# Patient Record
Sex: Female | Born: 1990 | Race: Black or African American | Hispanic: No | Marital: Single | Smoking: Never smoker
Health system: Southern US, Community
[De-identification: ages and names within clinical notes are randomized; demographics above are authoritative.]

## PROBLEM LIST (undated history)

## (undated) ENCOUNTER — Inpatient Hospital Stay (HOSPITAL_COMMUNITY): Payer: Self-pay

## (undated) DIAGNOSIS — D649 Anemia, unspecified: Secondary | ICD-10-CM

## (undated) HISTORY — PX: APPENDECTOMY: SHX54

## (undated) HISTORY — DX: Anemia, unspecified: D64.9

---

## 2010-01-20 ENCOUNTER — Inpatient Hospital Stay (HOSPITAL_COMMUNITY): Admission: AD | Admit: 2010-01-20 | Discharge: 2010-01-20 | Payer: Self-pay | Admitting: Obstetrics & Gynecology

## 2011-01-04 LAB — CBC
HCT: 31.4 % — ABNORMAL LOW (ref 36.0–46.0)
Hemoglobin: 10.5 g/dL — ABNORMAL LOW (ref 12.0–15.0)
MCHC: 33.3 g/dL (ref 30.0–36.0)
MCV: 98.6 fL (ref 78.0–100.0)
RBC: 3.18 MIL/uL — ABNORMAL LOW (ref 3.87–5.11)

## 2011-01-04 LAB — GC/CHLAMYDIA PROBE AMP, GENITAL: GC Probe Amp, Genital: NEGATIVE

## 2011-01-04 LAB — WET PREP, GENITAL
Trich, Wet Prep: NONE SEEN
Yeast Wet Prep HPF POC: NONE SEEN

## 2013-04-30 LAB — OB RESULTS CONSOLE ANTIBODY SCREEN: Antibody Screen: NEGATIVE

## 2013-04-30 LAB — OB RESULTS CONSOLE RUBELLA ANTIBODY, IGM: Rubella: IMMUNE

## 2013-08-01 ENCOUNTER — Encounter: Payer: Self-pay | Admitting: *Deleted

## 2013-08-06 ENCOUNTER — Encounter: Payer: Self-pay | Admitting: *Deleted

## 2013-08-06 DIAGNOSIS — O09293 Supervision of pregnancy with other poor reproductive or obstetric history, third trimester: Secondary | ICD-10-CM

## 2013-08-06 DIAGNOSIS — O34219 Maternal care for unspecified type scar from previous cesarean delivery: Secondary | ICD-10-CM | POA: Insufficient documentation

## 2013-08-13 ENCOUNTER — Ambulatory Visit (INDEPENDENT_AMBULATORY_CARE_PROVIDER_SITE_OTHER): Payer: Self-pay | Admitting: Obstetrics and Gynecology

## 2013-08-13 ENCOUNTER — Encounter: Payer: Self-pay | Admitting: Obstetrics and Gynecology

## 2013-08-13 VITALS — BP 111/59 | Temp 97.6°F | Ht 64.0 in | Wt 165.5 lb

## 2013-08-13 DIAGNOSIS — O09293 Supervision of pregnancy with other poor reproductive or obstetric history, third trimester: Secondary | ICD-10-CM

## 2013-08-13 DIAGNOSIS — O09299 Supervision of pregnancy with other poor reproductive or obstetric history, unspecified trimester: Secondary | ICD-10-CM

## 2013-08-13 DIAGNOSIS — O34219 Maternal care for unspecified type scar from previous cesarean delivery: Secondary | ICD-10-CM

## 2013-08-13 LAB — CBC
HCT: 29.2 % — ABNORMAL LOW (ref 36.0–46.0)
Hemoglobin: 10.3 g/dL — ABNORMAL LOW (ref 12.0–15.0)
MCHC: 35.3 g/dL (ref 30.0–36.0)
Platelets: 267 10*3/uL (ref 150–400)
RDW: 13 % (ref 11.5–15.5)
WBC: 7.2 10*3/uL (ref 4.0–10.5)

## 2013-08-13 LAB — POCT URINALYSIS DIP (DEVICE)
Bilirubin Urine: NEGATIVE
Glucose, UA: NEGATIVE mg/dL
Leukocytes, UA: NEGATIVE
Specific Gravity, Urine: 1.015 (ref 1.005–1.030)
Urobilinogen, UA: 0.2 mg/dL (ref 0.0–1.0)
pH: 7 (ref 5.0–8.0)

## 2013-08-13 NOTE — Patient Instructions (Signed)
Etonogestrel implant What is this medicine? ETONOGESTREL is a contraceptive (birth control) device. It is used to prevent pregnancy. It can be used for up to 3 years. This medicine may be used for other purposes; ask your health care provider or pharmacist if you have questions. What should I tell my health care provider before I take this medicine? They need to know if you have any of these conditions: -abnormal vaginal bleeding -blood vessel disease or blood clots -cancer of the breast, cervix, or liver -depression -diabetes -gallbladder disease -headaches -heart disease or recent heart attack -high blood pressure -high cholesterol -kidney disease -liver disease -renal disease -seizures -tobacco smoker -an unusual or allergic reaction to etonogestrel, other hormones, anesthetics or antiseptics, medicines, foods, dyes, or preservatives -pregnant or trying to get pregnant -breast-feeding How should I use this medicine? This device is inserted just under the skin on the inner side of your upper arm by a health care professional. Talk to your pediatrician regarding the use of this medicine in children. Special care may be needed. Overdosage: If you think you've taken too much of this medicine contact a poison control center or emergency room at once. Overdosage: If you think you have taken too much of this medicine contact a poison control center or emergency room at once. NOTE: This medicine is only for you. Do not share this medicine with others. What if I miss a dose? This does not apply. What may interact with this medicine? Do not take this medicine with any of the following medications: -amprenavir -bosentan -fosamprenavir This medicine may also interact with the following medications: -barbiturate medicines for inducing sleep or treating seizures -certain medicines for fungal infections like ketoconazole and itraconazole -griseofulvin -medicines to treat seizures like  carbamazepine, felbamate, oxcarbazepine, phenytoin, topiramate -modafinil -phenylbutazone -rifampin -some medicines to treat HIV infection like atazanavir, indinavir, lopinavir, nelfinavir, tipranavir, ritonavir -St. John's wort This list may not describe all possible interactions. Give your health care provider a list of all the medicines, herbs, non-prescription drugs, or dietary supplements you use. Also tell them if you smoke, drink alcohol, or use illegal drugs. Some items may interact with your medicine. What should I watch for while using this medicine? This product does not protect you against HIV infection (AIDS) or other sexually transmitted diseases. You should be able to feel the implant by pressing your fingertips over the skin where it was inserted. Tell your doctor if you cannot feel the implant. What side effects may I notice from receiving this medicine? Side effects that you should report to your doctor or health care professional as soon as possible: -allergic reactions like skin rash, itching or hives, swelling of the face, lips, or tongue -breast lumps -changes in vision -confusion, trouble speaking or understanding -dark urine -depressed mood -general ill feeling or flu-like symptoms -light-colored stools -loss of appetite, nausea -right upper belly pain -severe headaches -severe pain, swelling, or tenderness in the abdomen -shortness of breath, chest pain, swelling in a leg -signs of pregnancy -sudden numbness or weakness of the face, arm or leg -trouble walking, dizziness, loss of balance or coordination -unusual vaginal bleeding, discharge -unusually weak or tired -yellowing of the eyes or skin Side effects that usually do not require medical attention (Report these to your doctor or health care professional if they continue or are bothersome.): -acne -breast pain -changes in weight -cough -fever or chills -headache -irregular menstrual  bleeding -itching, burning, and vaginal discharge -pain or difficulty passing urine -sore throat This   list may not describe all possible side effects. Call your doctor for medical advice about side effects. You may report side effects to FDA at 1-800-FDA-1088. Where should I keep my medicine? This drug is given in a hospital or clinic and will not be stored at home. NOTE: This sheet is a summary. It may not cover all possible information. If you have questions about this medicine, talk to your doctor, pharmacist, or health care provider.  2013, [employer]. (06/25/2009 3:54:17 PM) Pregnancy - Third Trimester The third trimester of pregnancy (the last 3 months) is a period of the most rapid growth for you and your baby. The baby approaches a length of 20 inches and a weight of 6 to 10 pounds. The baby is adding on fat and getting ready for life outside your body. While inside, babies have periods of sleeping and waking, sucking thumbs, and hiccuping. You can often feel small contractions of the uterus. This is false labor. It is also called Braxton-Hicks contractions. This is like a practice for labor. The usual problems in this stage of pregnancy include more difficulty breathing, swelling of the hands and feet from water retention, and having to urinate more often because of the uterus and baby pressing on your bladder.  PRENATAL EXAMS  Blood work may continue to be done during prenatal exams. These tests are done to check on your health and the probable health of your baby. Blood work is used to follow your blood levels (hemoglobin). Anemia (low hemoglobin) is common during pregnancy. Iron and vitamins are given to help prevent this. You may also continue to be checked for diabetes. Some of the past blood tests may be done again.  The size of the uterus is measured during each visit. This makes sure your baby is growing properly according to your pregnancy dates.  Your blood pressure is  checked every prenatal visit. This is to make sure you are not getting toxemia.  Your urine is checked every prenatal visit for infection, diabetes, and protein.  Your weight is checked at each visit. This is done to make sure gains are happening at the suggested rate and that you and your baby are growing normally.  Sometimes, an ultrasound is performed to confirm the position and the proper growth and development of the baby. This is a test done that bounces harmless sound waves off the baby so your caregiver can more accurately determine a due date.  Discuss the type of pain medicine and anesthesia you will have during your labor and delivery.  Discuss the possibility and anesthesia if a cesarean section might be necessary.  Inform your caregiver if there is any mental or physical violence at home. Sometimes, a specialized non-stress test, contraction stress test, and biophysical profile are done to make sure the baby is not having a problem. Checking the amniotic fluid surrounding the baby is called an amniocentesis. The amniotic fluid is removed by sticking a needle into the belly (abdomen). This is sometimes done near the end of pregnancy if an early delivery is required. In this case, it is done to help make sure the baby's lungs are mature enough for the baby to live outside of the womb. If the lungs are not mature and it is unsafe to deliver the baby, an injection of cortisone medicine is given to the mother 1 to 2 days before the delivery. This helps the baby's lungs mature and makes it safer to deliver the baby. CHANGES OCCURING IN THE  THIRD TRIMESTER OF PREGNANCY Your body goes through many changes during pregnancy. They vary from person to person. Talk to your caregiver about changes you notice and are concerned about.  During the last trimester, you have probably had an increase in your appetite. It is normal to have cravings for certain foods. This varies from person to person and  pregnancy to pregnancy.  You may begin to get stretch marks on your hips, abdomen, and breasts. These are normal changes in the body during pregnancy. There are no exercises or medicines to take which prevent this change.  Constipation may be treated with a stool softener or adding bulk to your diet. Drinking lots of fluids, fiber in vegetables, fruits, and whole grains are helpful.  Exercising is also helpful. If you have been very active up until your pregnancy, most of these activities can be continued during your pregnancy. If you have been less active, it is helpful to start an exercise program such as walking. Consult your caregiver before starting exercise programs.  Avoid all smoking, alcohol, non-prescribed drugs, herbs and "street drugs" during your pregnancy. These chemicals affect the formation and growth of the baby. Avoid chemicals throughout the pregnancy to ensure the delivery of a healthy infant.  Backache, varicose veins, and hemorrhoids may develop or get worse.  You will tire more easily in the third trimester, which is normal.  The baby's movements may be stronger and more often.  You may become short of breath easily.  Your belly button may stick out.  A yellow discharge may leak from your breasts called colostrum.  You may have a bloody mucus discharge. This usually occurs a few days to a week before labor begins. HOME CARE INSTRUCTIONS   Keep your caregiver's appointments. Follow your caregiver's instructions regarding medicine use, exercise, and diet.  During pregnancy, you are providing food for you and your baby. Continue to eat regular, well-balanced meals. Choose foods such as meat, fish, milk and other low fat dairy products, vegetables, fruits, and whole-grain breads and cereals. Your caregiver will tell you of the ideal weight gain.  A physical sexual relationship may be continued throughout pregnancy if there are no other problems such as early  (premature) leaking of amniotic fluid from the membranes, vaginal bleeding, or belly (abdominal) pain.  Exercise regularly if there are no restrictions. Check with your caregiver if you are unsure of the safety of your exercises. Greater weight gain will occur in the last 2 trimesters of pregnancy. Exercising helps:  Control your weight.  Get you in shape for labor and delivery.  You lose weight after you deliver.  Rest a lot with legs elevated, or as needed for leg cramps or low back pain.  Wear a good support or jogging bra for breast tenderness during pregnancy. This may help if worn during sleep. Pads or tissues may be used in the bra if you are leaking colostrum.  Do not use hot tubs, steam rooms, or saunas.  Wear your seat belt when driving. This protects you and your baby if you are in an accident.  Avoid raw meat, cat litter boxes and soil used by cats. These carry germs that can cause birth defects in the baby.  It is easier to leak urine during pregnancy. Tightening up and strengthening the pelvic muscles will help with this problem. You can practice stopping your urination while you are going to the bathroom. These are the same muscles you need to strengthen. It is also the  muscles you would use if you were trying to stop from passing gas. You can practice tightening these muscles up 10 times a set and repeating this about 3 times per day. Once you know what muscles to tighten up, do not perform these exercises during urination. It is more likely to cause an infection by backing up the urine.  Ask for help if you have financial, counseling, or nutritional needs during pregnancy. Your caregiver will be able to offer counseling for these needs as well as refer you for other special needs.  Make a list of emergency phone numbers and have them available.  Plan on getting help from family or friends when you go home from the hospital.  Make a trial run to the hospital.  Take  prenatal classes with the father to understand, practice, and ask questions about the labor and delivery.  Prepare the baby's room or nursery.  Do not travel out of the city unless it is absolutely necessary and with the advice of your caregiver.  Wear only low or no heal shoes to have better balance and prevent falling. MEDICINES AND DRUG USE IN PREGNANCY  Take prenatal vitamins as directed. The vitamin should contain 1 milligram of folic acid. Keep all vitamins out of reach of children. Only a couple vitamins or tablets containing iron may be fatal to a baby or young child when ingested.  Avoid use of all medicines, including herbs, over-the-counter medicines, not prescribed or suggested by your caregiver. Only take over-the-counter or prescription medicines for pain, discomfort, or fever as directed by your caregiver. Do not use aspirin, ibuprofen or naproxen unless approved by your caregiver.  Let your caregiver also know about herbs you may be using.  Alcohol is related to a number of birth defects. This includes fetal alcohol syndrome. All alcohol, in any form, should be avoided completely. Smoking will cause low birth rate and premature babies.  Illegal drugs are very harmful to the baby. They are absolutely forbidden. A baby born to an addicted mother will be addicted at birth. The baby will go through the same withdrawal an adult does. SEEK MEDICAL CARE IF: You have any concerns or worries during your pregnancy. It is better to call with your questions if you feel they cannot wait, rather than worry about them. SEEK IMMEDIATE MEDICAL CARE IF:   An unexplained oral temperature above 102 F (38.9 C) develops, or as your caregiver suggests.  You have leaking of fluid from the vagina. If leaking membranes are suspected, take your temperature and tell your caregiver of this when you call.  There is vaginal spotting, bleeding or passing clots. Tell your caregiver of the amount and how  many pads are used.  You develop a bad smelling vaginal discharge with a change in the color from clear to white.  You develop vomiting that lasts more than 24 hours.  You develop chills or fever.  You develop shortness of breath.  You develop burning on urination.  You loose more than 2 pounds of weight or gain more than 2 pounds of weight or as suggested by your caregiver.  You notice sudden swelling of your face, hands, and feet or legs.  You develop belly (abdominal) pain. Round ligament discomfort is a common non-cancerous (benign) cause of abdominal pain in pregnancy. Your caregiver still must evaluate you.  You develop a severe headache that does not go away.  You develop visual problems, blurred or double vision.  If you have not  felt your baby move for more than 1 hour. If you think the baby is not moving as much as usual, eat something with sugar in it and lie down on your left side for an hour. The baby should move at least 4 to 5 times per hour. Call right away if your baby moves less than that.  You fall, are in a car accident, or any kind of trauma.  There is mental or physical violence at home. Document Released: 09/26/2001 Document Revised: 06/26/2012 Document Reviewed: 03/31/2009 [facility] Patient Information 2014 [place], [place].

## 2013-08-13 NOTE — Progress Notes (Signed)
P=78 Pt. C/o of a little intermittent pressure in pelvic area.  Discussed appropriate weight gain for this pregnancy based on height and weight. Pt. Verbalized understanding.  New OB packet given.  Pt. Will get flu shot today.  Pt. Would like to discuss VBAC.

## 2013-08-13 NOTE — Progress Notes (Signed)
22 yo G2P1001 wel-dated at [redacted]w[redacted]d moved here from [facility] with records of [facility]. Documented Pfannensteil and desires TOLAC> conssent given and counseled.  Reviewed PN records- essentially uncomplicated course. Declines flu vaccine today, will get tdap. Some RLP sxs. Denies UCs, VB.  Reports abruption at 42+ wks with P1, no labor. Denies hypertension or illicit drugs then> need record form [place] delivery. Marland Kitchen

## 2013-08-14 LAB — PRESCRIPTION MONITORING PROFILE (19 PANEL)
Barbiturate Screen, Urine: NEGATIVE ng/mL
Cannabinoid Scrn, Ur: NEGATIVE ng/mL
Cocaine Metabolites: NEGATIVE ng/mL
Creatinine, Urine: 45.62 mg/dL (ref >20.0)
MDMA URINE: NEGATIVE ng/mL
Methadone Screen, Urine: NEGATIVE ng/mL
Opiate Screen, Urine: NEGATIVE ng/mL
Tramadol Scrn, Ur: NEGATIVE ng/mL
pH, Initial: 7 pH (ref 4.5–8.9)

## 2013-08-14 LAB — ALCOHOL METABOLITE (ETG), URINE: Ethyl Glucuronide (EtG): NEGATIVE ng/mL

## 2013-08-14 LAB — HIV ANTIBODY (ROUTINE TESTING W REFLEX): HIV: NONREACTIVE

## 2013-08-18 ENCOUNTER — Telehealth: Payer: Self-pay | Admitting: *Deleted

## 2013-08-18 NOTE — Telephone Encounter (Signed)
Called Kathleen Castro and left a message we are calling with some information and need to make an appointment= please call the clinic

## 2013-08-18 NOTE — Telephone Encounter (Signed)
Message copied by Gerome Apley on Mon Aug 18, 2013  1:05 PM ------      Message from: Danae Orleans      Created: Fri Aug 15, 2013  8:42 PM       Please schedule for 3 hr OGTT ------

## 2013-08-20 NOTE — Telephone Encounter (Signed)
Called pt and left message that I am calling with important test result information. Please call back and indicate whether we may leave the results on your voice mail.

## 2013-08-21 NOTE — Telephone Encounter (Signed)
Pt called nurse line requesting test results.  Permission to leave message on voicemail.  Spoke with pt regarding appt  On 11/11 @ 0800 for 3hr GTT, instructions given. Prenatal appt for 1415 moved to be within the 3 hours that morning.

## 2013-08-26 ENCOUNTER — Encounter: Payer: Self-pay | Admitting: Obstetrics & Gynecology

## 2013-08-26 ENCOUNTER — Encounter: Payer: Self-pay | Admitting: Advanced Practice Midwife

## 2013-08-27 ENCOUNTER — Encounter: Payer: Self-pay | Admitting: *Deleted

## 2013-08-27 ENCOUNTER — Encounter: Payer: Self-pay | Admitting: Obstetrics and Gynecology

## 2013-09-02 ENCOUNTER — Ambulatory Visit (INDEPENDENT_AMBULATORY_CARE_PROVIDER_SITE_OTHER): Payer: Medicaid Other | Admitting: Obstetrics and Gynecology

## 2013-09-02 VITALS — BP 99/63 | Temp 96.8°F | Wt 167.4 lb

## 2013-09-02 DIAGNOSIS — Z3483 Encounter for supervision of other normal pregnancy, third trimester: Secondary | ICD-10-CM

## 2013-09-02 DIAGNOSIS — R87810 Cervical high risk human papillomavirus (HPV) DNA test positive: Secondary | ICD-10-CM

## 2013-09-02 DIAGNOSIS — O09293 Supervision of pregnancy with other poor reproductive or obstetric history, third trimester: Secondary | ICD-10-CM

## 2013-09-02 DIAGNOSIS — R87610 Atypical squamous cells of undetermined significance on cytologic smear of cervix (ASC-US): Secondary | ICD-10-CM | POA: Insufficient documentation

## 2013-09-02 DIAGNOSIS — O34219 Maternal care for unspecified type scar from previous cesarean delivery: Secondary | ICD-10-CM

## 2013-09-02 DIAGNOSIS — O09299 Supervision of pregnancy with other poor reproductive or obstetric history, unspecified trimester: Secondary | ICD-10-CM

## 2013-09-02 LAB — POCT URINALYSIS DIP (DEVICE)
Bilirubin Urine: NEGATIVE
Glucose, UA: NEGATIVE mg/dL
Ketones, ur: 80 mg/dL — AB
Leukocytes, UA: NEGATIVE
Specific Gravity, Urine: 1.025 (ref 1.005–1.030)

## 2013-09-02 NOTE — Progress Notes (Signed)
Desires TOLAC>VBAC consent and discussed R/B. Will try to get op note primary C/S in [place].  3 hr OGTT tomorrow. Anatomy US scheduled 11/25.

## 2013-09-02 NOTE — Patient Instructions (Signed)

## 2013-09-02 NOTE — Progress Notes (Signed)
P=68  Pt here for 3 hour GTT, too late in day for test. Will reschedule for tomorrow @ 100.

## 2013-09-03 ENCOUNTER — Encounter: Payer: Self-pay | Admitting: *Deleted

## 2013-09-03 ENCOUNTER — Other Ambulatory Visit: Payer: Medicaid Other

## 2013-09-03 DIAGNOSIS — O99810 Abnormal glucose complicating pregnancy: Secondary | ICD-10-CM

## 2013-09-04 LAB — GLUCOSE TOLERANCE, 3 HOURS
Glucose Tolerance, 1 hour: 112 mg/dL (ref 70–189)
Glucose Tolerance, 2 hour: 113 mg/dL (ref 70–164)
Glucose Tolerance, Fasting: 69 mg/dL — ABNORMAL LOW (ref 70–104)

## 2013-09-05 ENCOUNTER — Encounter: Payer: Self-pay | Admitting: Obstetrics and Gynecology

## 2013-09-09 ENCOUNTER — Ambulatory Visit (HOSPITAL_COMMUNITY)
Admission: RE | Admit: 2013-09-09 | Discharge: 2013-09-09 | Disposition: A | Discharge status: Home / Self Care | Payer: Medicaid Other | Source: Ambulatory Visit | Attending: Obstetrics and Gynecology | Admitting: Obstetrics and Gynecology

## 2013-09-09 ENCOUNTER — Ambulatory Visit (HOSPITAL_COMMUNITY): Admission: RE | Admit: 2013-09-09 | Payer: Medicaid Other | Source: Ambulatory Visit

## 2013-09-09 DIAGNOSIS — Z3483 Encounter for supervision of other normal pregnancy, third trimester: Secondary | ICD-10-CM

## 2013-09-09 DIAGNOSIS — O09293 Supervision of pregnancy with other poor reproductive or obstetric history, third trimester: Secondary | ICD-10-CM

## 2013-09-09 DIAGNOSIS — R87610 Atypical squamous cells of undetermined significance on cytologic smear of cervix (ASC-US): Secondary | ICD-10-CM

## 2013-09-09 DIAGNOSIS — O34219 Maternal care for unspecified type scar from previous cesarean delivery: Secondary | ICD-10-CM | POA: Insufficient documentation

## 2013-09-09 DIAGNOSIS — Z3689 Encounter for other specified antenatal screening: Secondary | ICD-10-CM | POA: Insufficient documentation

## 2013-09-17 ENCOUNTER — Ambulatory Visit (INDEPENDENT_AMBULATORY_CARE_PROVIDER_SITE_OTHER): Payer: Medicaid Other | Admitting: Obstetrics and Gynecology

## 2013-09-17 ENCOUNTER — Encounter: Payer: Self-pay | Admitting: Obstetrics and Gynecology

## 2013-09-17 VITALS — BP 114/65 | Temp 97.8°F | Wt 172.0 lb

## 2013-09-17 DIAGNOSIS — O09299 Supervision of pregnancy with other poor reproductive or obstetric history, unspecified trimester: Secondary | ICD-10-CM

## 2013-09-17 DIAGNOSIS — O09293 Supervision of pregnancy with other poor reproductive or obstetric history, third trimester: Secondary | ICD-10-CM

## 2013-09-17 DIAGNOSIS — O34219 Maternal care for unspecified type scar from previous cesarean delivery: Secondary | ICD-10-CM

## 2013-09-17 LAB — POCT URINALYSIS DIP (DEVICE)
Bilirubin Urine: NEGATIVE
Glucose, UA: NEGATIVE mg/dL
pH: 7.5 (ref 5.0–8.0)

## 2013-09-17 NOTE — Progress Notes (Signed)
Reviewed notes from C/S: Presented for IOL at 42 wks, fetal bradycardia>LTCS, large abruption. [place] 09/12/13: 72nd%ile growth F/U 3 wks for F/U anatomy. Will c/w MD re timing for delivery (?40 wks) due to hx unexplained abruption.

## 2013-09-17 NOTE — Patient Instructions (Signed)
Third Trimester of Pregnancy  The third trimester is from week 29 through week 42, months 7 through 9. The third trimester is a time when the fetus is growing rapidly. At the end of the ninth month, the fetus is about 20 inches in length and weighs 6 10 pounds.   BODY CHANGES  Your body goes through many changes during pregnancy. The changes vary from woman to woman.    Your weight will continue to increase. You can expect to gain 25 35 pounds (11 16 kg) by the end of the pregnancy.   You may begin to get stretch marks on your hips, abdomen, and breasts.   You may urinate more often because the fetus is moving lower into your pelvis and pressing on your bladder.   You may develop or continue to have heartburn as a result of your pregnancy.   You may develop constipation because certain hormones are causing the muscles that push waste through your intestines to slow down.   You may develop hemorrhoids or swollen, bulging veins (varicose veins).   You may have pelvic pain because of the weight gain and pregnancy hormones relaxing your joints between the bones in your pelvis. Back aches may result from over exertion of the muscles supporting your posture.   Your breasts will continue to grow and be tender. A yellow discharge may leak from your breasts called colostrum.   Your belly button may stick out.   You may feel short of breath because of your expanding uterus.   You may notice the fetus "dropping," or moving lower in your abdomen.   You may have a bloody mucus discharge. This usually occurs a few days to a week before labor begins.   Your cervix becomes thin and soft (effaced) near your due date.  WHAT TO EXPECT AT YOUR PRENATAL EXAMS   You will have prenatal exams every 2 weeks until week 36. Then, you will have weekly prenatal exams. During a routine prenatal visit:   You will be weighed to make sure you and the fetus are growing normally.   Your blood pressure is taken.   Your abdomen will be  measured to track your baby's growth.   The fetal heartbeat will be listened to.   Any test results from the previous visit will be discussed.   You may have a cervical check near your due date to see if you have effaced.  At around 36 weeks, your caregiver will check your cervix. At the same time, your caregiver will also perform a test on the secretions of the vaginal tissue. This test is to determine if a type of bacteria, Group B streptococcus, is present. Your caregiver will explain this further.  Your caregiver may ask you:   What your birth plan is.   How you are feeling.   If you are feeling the baby move.   If you have had any abnormal symptoms, such as leaking fluid, bleeding, severe headaches, or abdominal cramping.   If you have any questions.  Other tests or screenings that may be performed during your third trimester include:   Blood tests that check for low iron levels (anemia).   Fetal testing to check the health, activity level, and growth of the fetus. Testing is done if you have certain medical conditions or if there are problems during the pregnancy.  FALSE LABOR  You may feel small, irregular contractions that eventually go away. These are called Braxton Hicks contractions, or   false labor. Contractions may last for hours, days, or even weeks before true labor sets in. If contractions come at regular intervals, intensify, or become painful, it is best to be seen by your caregiver.   SIGNS OF LABOR    Menstrual-like cramps.   Contractions that are 5 minutes apart or less.   Contractions that start on the top of the uterus and spread down to the lower abdomen and back.   A sense of increased pelvic pressure or back pain.   A watery or bloody mucus discharge that comes from the vagina.  If you have any of these signs before the 37th week of pregnancy, call your caregiver right away. You need to go to the hospital to get checked immediately.  HOME CARE INSTRUCTIONS    Avoid all  smoking, herbs, alcohol, and unprescribed drugs. These chemicals affect the formation and growth of the baby.   Follow your caregiver's instructions regarding medicine use. There are medicines that are either safe or unsafe to take during pregnancy.   Exercise only as directed by your caregiver. Experiencing uterine cramps is a good sign to stop exercising.   Continue to eat regular, healthy meals.   Wear a good support bra for breast tenderness.   Do not use hot tubs, steam rooms, or saunas.   Wear your seat belt at all times when driving.   Avoid raw meat, uncooked cheese, cat litter boxes, and soil used by cats. These carry germs that can cause birth defects in the baby.   Take your prenatal vitamins.   Try taking a stool softener (if your caregiver approves) if you develop constipation. Eat more high-fiber foods, such as fresh vegetables or fruit and whole grains. Drink plenty of fluids to keep your urine clear or pale yellow.   Take warm sitz baths to soothe any pain or discomfort caused by hemorrhoids. Use hemorrhoid cream if your caregiver approves.   If you develop varicose veins, wear support hose. Elevate your feet for 15 minutes, 3 4 times a day. Limit salt in your diet.   Avoid heavy lifting, wear low heal shoes, and practice good posture.   Rest a lot with your legs elevated if you have leg cramps or low back pain.   Visit your dentist if you have not gone during your pregnancy. Use a soft toothbrush to brush your teeth and be gentle when you floss.   A sexual relationship may be continued unless your caregiver directs you otherwise.   Do not travel far distances unless it is absolutely necessary and only with the approval of your caregiver.   Take prenatal classes to understand, practice, and ask questions about the labor and delivery.   Make a trial run to the hospital.   Pack your hospital bag.   Prepare the baby's nursery.   Continue to go to all your prenatal visits as directed  by your caregiver.  SEEK MEDICAL CARE IF:   You are unsure if you are in labor or if your water has broken.   You have dizziness.   You have mild pelvic cramps, pelvic pressure, or nagging pain in your abdominal area.   You have persistent nausea, vomiting, or diarrhea.   You have a bad smelling vaginal discharge.   You have pain with urination.  SEEK IMMEDIATE MEDICAL CARE IF:    You have a fever.   You are leaking fluid from your vagina.   You have spotting or bleeding from your vagina.     You have severe abdominal cramping or pain.   You have rapid weight loss or gain.   You have shortness of breath with chest pain.   You notice sudden or extreme swelling of your face, hands, ankles, feet, or legs.   You have not felt your baby move in over an hour.   You have severe headaches that do not go away with medicine.   You have vision changes.  Document Released: 09/26/2001 Document Revised: 06/04/2013 Document Reviewed: 12/03/2012  [employer] Patient Information 2014 [employer].

## 2013-09-17 NOTE — Progress Notes (Signed)
Called [facility] to request patient's c/s records [phone]). Spoke with Eber Jones of medical records who stated she will fax them over.

## 2013-09-17 NOTE — Progress Notes (Signed)
Pulse-77 Patient reports pelvic pressure and braxton hicks contractions

## 2013-09-18 ENCOUNTER — Encounter: Payer: Self-pay | Admitting: *Deleted

## 2013-09-23 ENCOUNTER — Ambulatory Visit (HOSPITAL_COMMUNITY)
Admission: RE | Admit: 2013-09-23 | Discharge: 2013-09-23 | Disposition: A | Discharge status: Home / Self Care | Payer: Medicaid Other | Source: Ambulatory Visit | Attending: Obstetrics and Gynecology | Admitting: Obstetrics and Gynecology

## 2013-09-23 DIAGNOSIS — O358XX0 Maternal care for other (suspected) fetal abnormality and damage, not applicable or unspecified: Secondary | ICD-10-CM | POA: Insufficient documentation

## 2013-09-23 DIAGNOSIS — O34219 Maternal care for unspecified type scar from previous cesarean delivery: Secondary | ICD-10-CM | POA: Insufficient documentation

## 2013-09-23 DIAGNOSIS — Z3689 Encounter for other specified antenatal screening: Secondary | ICD-10-CM | POA: Insufficient documentation

## 2013-10-02 ENCOUNTER — Encounter: Payer: Self-pay | Admitting: Obstetrics and Gynecology

## 2013-10-02 ENCOUNTER — Ambulatory Visit (INDEPENDENT_AMBULATORY_CARE_PROVIDER_SITE_OTHER): Payer: Medicaid Other | Admitting: Obstetrics and Gynecology

## 2013-10-02 VITALS — BP 105/64 | Temp 96.7°F | Wt 170.8 lb

## 2013-10-02 DIAGNOSIS — O34219 Maternal care for unspecified type scar from previous cesarean delivery: Secondary | ICD-10-CM

## 2013-10-02 LAB — POCT URINALYSIS DIP (DEVICE)
Bilirubin Urine: NEGATIVE
Leukocytes, UA: NEGATIVE
Nitrite: NEGATIVE
Protein, ur: NEGATIVE mg/dL
Specific Gravity, Urine: 1.01 (ref 1.005–1.030)
pH: 7.5 (ref 5.0–8.0)

## 2013-10-02 NOTE — Addendum Note (Signed)
Addended by: Franchot Mimes on: 10/02/2013 02:36 PM   Modules accepted: Orders

## 2013-10-02 NOTE — Progress Notes (Signed)
Doing well. D/W Dr. Debroah Loop re: hx abruption: will start FATs after 40 wks induce by 41 wks.

## 2013-10-02 NOTE — Patient Instructions (Signed)
Vaginal Birth After Cesarean Delivery Vaginal birth after Cesarean delivery (VBAC) is giving birth vaginally after previously delivering a baby by a cesarean. In the past, if a woman had a Cesarean delivery, all births afterwards would be done by Cesarean delivery. This is no longer true. It can be safe for the mother to try a vaginal delivery after having a Cesarean. The final decision to have a VBAC or repeat Cesarean delivery should be between the patient and her caregiver. The risks and benefits can be discussed relative to the reason for, and the type of the previous Cesarean delivery. WOMEN WHO PLAN TO HAVE A VBAC SHOULD CHECK WITH THEIR DOCTOR TO BE SURE THAT:  The previous Cesarean was done with a low transverse uterine incision (not a vertical classical incision).  The birth canal is big enough for the baby.  There were no other operations on the uterus.  They will have an electronic fetal monitor (EFM) on at all times during labor.  An operating room would be available and ready in case an emergency Cesarean is needed.  A doctor and surgical nursing staff would be available at all times during labor to be ready to do an emergency Cesarean if necessary.  An anesthesiologist would be present in case an emergency Cesarean is needed.  The nursery is prepared and has adequate personnel and necessary equipment available to care for the baby in case of an emergency Cesarean. BENEFITS OF VBAC:  Shorter stay in the hospital.  Lower delivery, nursery and hospital costs.  Less blood loss and need for blood transfusions.  Less fever and discomfort from major surgery.  Lower risk of blood clots.  Lower risk of infection.  Shorter recovery after going home.  Lower risk of other surgical complications, such as opening of the incision or hernia in the incision.  Decreased risk of injury to other organs.  Decreased risk for having to remove the uterus (hysterectomy).  Decreased risk  for the placenta to completely or partially cover the opening of the uterus (placenta previa) with a future pregnancy.  Ability to have a larger family if desired. RISKS OF A VBAC:  Rupture of the uterus.  Having to remove the uterus (hysterectomy) if it ruptures.  All the complications of major surgery and/or injury to other organs.  Excessive bleeding, blood clots and infection.  Lower Apgar scores (method to evaluate the newborn based on appearance, pulse, grimace, activity, and respiration) and more risks to the baby.  There is a higher risk of uterine rupture if you induce or augment labor.  There is a higher risk of uterine rupture if you use medications to ripen the cervix. VBAC SHOULD NOT BE DONE IF:  The previous Cesarean was done with a vertical (classical) or T-shaped incision, or you do not know what kind of an incision was made.  You had a ruptured uterus.  You had surgery on your uterus.  You have medical or obstetrical problems.  There are problems with the baby.  There were two previous Cesarean deliveries and no vaginal deliveries. OTHER FACTS TO KNOW ABOUT VBAC:  It is safe to have an epidural anesthetic with VBAC.  It is safe to turn the baby from a breech position (attempt an external cephalic version).  It is safe to try a VBAC with twins.  Pregnancies later than 40 weeks have not been successful with VBAC.  There is an increased failure rate of a VBAC in obese pregnant women.  There is  an increased failure rate with VABC if the baby weighs 8.8 pounds (4000 grams) or more.  There is an increased failure rate if the time between the Cesarean and VBAC is less than 19 months.  There is an increased failure rate if pre-eclampsia is present (high blood pressure, protein in the urine and swelling of face and extremities).  VBAC is very successful if there was a previous vaginal birth.  VBAC is very successful when the labor starts spontaneously before  the due date.  Delivery of VBAC is similar to having a normal spontaneous vaginal delivery. It is important to discuss VBAC with your caregiver early in the pregnancy so you can understand the risks, benefits and options. It will give you time to decide what is best in your particular case relevant to the reason for your previous Cesarean delivery. It should be understood that medical changes in the mother or pregnancy may occur during the pregnancy, which make it necessary to change you or your caregiver's initial decision. The counseling, concerns and decisions should be documented in the medical record and signed by all parties. Document Released: 03/25/2007 Document Revised: 12/25/2011 Document Reviewed: 11/13/2008 [facility] Patient Information 2014 [place], [place]. Vaginal Bleeding During Pregnancy, Third Trimester A small amount of bleeding (spotting) is relatively common in pregnancy. Sometimes bleeding may be "normal." Bleeding in the third trimester can be very serious for the mother and the baby, and should be reported to your caregiver right away. It is very important to follow your caregiver's instructions. CAUSES Possible causes of bleeding during the third trimester:  The placenta may be partially covering or completely covering the opening to the cervix (placenta previa).  The placenta may have separated from the uterus (abruption of the placenta).  There may be an infection or growth on the cervix.  You may be starting labor, called discharging of the mucus plug.  The placenta may grow into the muscle layer of the uterus (placenta accreta). DIAGNOSIS  Your caregiver may do:  Pelvic exam in the delivery room, called a double set up (being ready to do an emergency cesarean delivery, if necessary).  Blood tests, to see if you are anemic (not having enough red blood cells).  Ultrasound and fetal monitoring, to see if the baby is having problems.  Ultrasound, to find out why  you are bleeding. TREATMENT   You may need to remain in the hospital.  You may be given an IV (intravenous) while you are in the hospital.  You may be put on strict bed rest.  You may need a blood transfusion.  You may be placed on oxygen.  The baby may need to be delivered immediately. HOME CARE INSTRUCTIONS   Your caregiver may order bed rest (getting up to go to the bathroom only). At this time, you may need to make arrangements for the care of children and for other responsibilities.  Keep track of the number of pads you use each day and how soaked (saturated) they are. Write this down.  Do not use tampons. Do not douche.  Do not have sexual intercourse or any sexual activity that may cause an orgasm, until approved by your caregiver.  Follow your caregiver's advice about lifting, driving, physical and social activities.  Eat a balanced and nutritious diet.  Get plenty of rest and sleep.  Do not drink alcohol or smoke. SEEK IMMEDIATE MEDICAL CARE IF:   You experience severe cramps or pain in your back or belly (abdomen).  You  have an oral temperature above 102 F (38.9 C), not controlled by medicine.  You develop chills.  You have a gush of fluid from the vagina.  You pass large clots or tissue. Save any tissue for your caregiver to inspect.  Your bleeding increases or you become light-headed or weak.  You pass out.  You feel less movement or no movement of the baby. Document Released: 12/23/2002 Document Revised: 12/25/2011 Document Reviewed: 08/30/2009 [facility] Patient Information 2014 [address], [place].

## 2013-10-02 NOTE — Progress Notes (Signed)
Pulse- 75 Reports pressure in pelvis

## 2013-10-03 LAB — GC/CHLAMYDIA PROBE AMP: CT Probe RNA: NEGATIVE

## 2013-10-04 LAB — CULTURE, BETA STREP (GROUP B ONLY)

## 2013-10-07 ENCOUNTER — Ambulatory Visit (INDEPENDENT_AMBULATORY_CARE_PROVIDER_SITE_OTHER): Payer: Medicaid Other | Admitting: Obstetrics & Gynecology

## 2013-10-07 VITALS — BP 111/62 | Temp 96.9°F | Wt 173.0 lb

## 2013-10-07 DIAGNOSIS — O34219 Maternal care for unspecified type scar from previous cesarean delivery: Secondary | ICD-10-CM

## 2013-10-07 DIAGNOSIS — Z3490 Encounter for supervision of normal pregnancy, unspecified, unspecified trimester: Secondary | ICD-10-CM

## 2013-10-07 LAB — POCT URINALYSIS DIP (DEVICE)
Bilirubin Urine: NEGATIVE
Hgb urine dipstick: NEGATIVE
Ketones, ur: NEGATIVE mg/dL

## 2013-10-07 NOTE — Progress Notes (Signed)
No problems, desires VBAC

## 2013-10-07 NOTE — Patient Instructions (Signed)

## 2013-10-07 NOTE — Progress Notes (Signed)
P=72  Pressure in vaginal most of the day.

## 2013-10-14 ENCOUNTER — Encounter: Payer: Self-pay | Admitting: Obstetrics & Gynecology

## 2013-10-14 ENCOUNTER — Ambulatory Visit (INDEPENDENT_AMBULATORY_CARE_PROVIDER_SITE_OTHER): Payer: Medicaid Other | Admitting: Obstetrics & Gynecology

## 2013-10-14 VITALS — BP 105/62 | Temp 96.9°F | Wt 173.3 lb

## 2013-10-14 DIAGNOSIS — O09299 Supervision of pregnancy with other poor reproductive or obstetric history, unspecified trimester: Secondary | ICD-10-CM

## 2013-10-14 DIAGNOSIS — O09293 Supervision of pregnancy with other poor reproductive or obstetric history, third trimester: Secondary | ICD-10-CM

## 2013-10-14 NOTE — Progress Notes (Signed)
Pt with no complaints. Labor precautions reviewed F/u 1 week Nexplanon

## 2013-10-14 NOTE — Patient Instructions (Signed)
Breastfeeding Deciding to breastfeed is one of the best choices you can make for you and your baby. A change in hormones during pregnancy causes your breast tissue to grow and increases the number and size of your milk ducts. These hormones also allow proteins, sugars, and fats from your blood supply to make breast milk in your milk-producing glands. Hormones prevent breast milk from being released before your baby is born as well as prompt milk flow after birth. Once breastfeeding has begun, thoughts of your baby, as well as his or her sucking or crying, can stimulate the release of milk from your milk-producing glands.  BENEFITS OF BREASTFEEDING For Your Baby  Your first milk (colostrum) helps your baby's digestive system function better.   There are antibodies in your milk that help your baby fight off infections.   Your baby has a lower incidence of asthma, allergies, and sudden infant death syndrome.   The nutrients in breast milk are better for your baby than infant formulas and are designed uniquely for your baby's needs.   Breast milk improves your baby's brain development.   Your baby is less likely to develop other conditions, such as childhood obesity, asthma, or type 2 diabetes mellitus.  For You   Breastfeeding helps to create a very special bond between you and your baby.   Breastfeeding is convenient. Breast milk is always available at the correct temperature and costs nothing.   Breastfeeding helps to burn calories and helps you lose the weight gained during pregnancy.   Breastfeeding makes your uterus contract to its prepregnancy size faster and slows bleeding (lochia) after you give birth.   Breastfeeding helps to lower your risk of developing type 2 diabetes mellitus, osteoporosis, and breast or ovarian cancer later in life. SIGNS THAT YOUR BABY IS HUNGRY Early Signs of Hunger  Increased alertness or activity.  Stretching.  Movement of the head from  side to side.  Movement of the head and opening of the mouth when the corner of the mouth or cheek is stroked (rooting).  Increased sucking sounds, smacking lips, cooing, sighing, or squeaking.  Hand-to-mouth movements.  Increased sucking of fingers or hands. Late Signs of Hunger  Fussing.  Intermittent crying. Extreme Signs of Hunger Signs of extreme hunger will require calming and consoling before your baby will be able to breastfeed successfully. Do not wait for the following signs of extreme hunger to occur before you initiate breastfeeding:   Restlessness.  A loud, strong cry.   Screaming. BREASTFEEDING BASICS Breastfeeding Initiation  Find a comfortable place to sit or lie down, with your neck and back well supported.  Place a pillow or rolled up blanket under your baby to bring him or her to the level of your breast (if you are seated). Nursing pillows are specially designed to help support your arms and your baby while you breastfeed.  Make sure that your baby's abdomen is facing your abdomen.   Gently massage your breast. With your fingertips, massage from your chest wall toward your nipple in a circular motion. This encourages milk flow. You may need to continue this action during the feeding if your milk flows slowly.  Support your breast with 4 fingers underneath and your thumb above your nipple. Make sure your fingers are well away from your nipple and your baby's mouth.   Stroke your baby's lips gently with your finger or nipple.   When your baby's mouth is open wide enough, quickly bring your baby to your   breast, placing your entire nipple and as much of the colored area around your nipple (areola) as possible into your baby's mouth.   More areola should be visible above your baby's upper lip than below the lower lip.   Your baby's tongue should be between his or her lower gum and your breast.   Ensure that your baby's mouth is correctly positioned  around your nipple (latched). Your baby's lips should create a seal on your breast and be turned out (everted).  It is common for your baby to suck about 2 3 minutes in order to start the flow of breast milk. Latching Teaching your baby how to latch on to your breast properly is very important. An improper latch can cause nipple pain and decreased milk supply for you and poor weight gain in your baby. Also, if your baby is not latched onto your nipple properly, he or she may swallow some air during feeding. This can make your baby fussy. Burping your baby when you switch breasts during the feeding can help to get rid of the air. However, teaching your baby to latch on properly is still the best way to prevent fussiness from swallowing air while breastfeeding. Signs that your baby has successfully latched on to your nipple:    Silent tugging or silent sucking, without causing you pain.   Swallowing heard between every 3 4 sucks.    Muscle movement above and in front of his or her ears while sucking.  Signs that your baby has not successfully latched on to nipple:   Sucking sounds or smacking sounds from your baby while breastfeeding.  Nipple pain. If you think your baby has not latched on correctly, slip your finger into the corner of your baby's mouth to break the suction and place it between your baby's gums. Attempt breastfeeding initiation again. Signs of Successful Breastfeeding Signs from your baby:   A gradual decrease in the number of sucks or complete cessation of sucking.   Falling asleep.   Relaxation of his or her body.   Retention of a small amount of milk in his or her mouth.   Letting go of your breast by himself or herself. Signs from you:  Breasts that have increased in firmness, weight, and size 1 3 hours after feeding.   Breasts that are softer immediately after breastfeeding.  Increased milk volume, as well as a change in milk consistency and color by  the 5th day of breastfeeding.   Nipples that are not sore, cracked, or bleeding. Signs That Your Randel Books is Getting Enough Milk  Wetting at least 3 diapers in a 24-hour period. The urine should be clear and pale yellow by age 64411 days.  At least 3 stools in a 24-hour period by age 64411 days. The stool should be soft and yellow.  At least 3 stools in a 24-hour period by age 644 days. The stool should be seedy and yellow.  No loss of weight greater than 10% of birth weight during the first 22 days of age.  Average weight gain of 4 7 ounces (120 210 mL) per week after age 64 days.  Consistent daily weight gain by age 60 days, without weight loss after the age of 2 weeks. After a feeding, your baby may spit up a small amount. This is common. BREASTFEEDING FREQUENCY AND DURATION Frequent feeding will help you make more milk and can prevent sore nipples and breast engorgement. Breastfeed when you feel the need to reduce  the fullness of your breasts or when your baby shows signs of hunger. This is called "breastfeeding on demand." Avoid introducing a pacifier to your baby while you are working to establish breastfeeding (the first 4 6 weeks after your baby is born). After this time you may choose to use a pacifier. Research has shown that pacifier use during the first year of a baby's life decreases the risk of sudden infant death syndrome (SIDS). Allow your baby to feed on each breast as long as he or she wants. Breastfeed until your baby is finished feeding. When your baby unlatches or falls asleep while feeding from the first breast, offer the second breast. Because newborns are often sleepy in the first few weeks of life, you may need to awaken your baby to get him or her to feed. Breastfeeding times will vary from baby to baby. However, the following rules can serve as a guide to help you ensure that your baby is properly fed:  Newborns (babies 4 weeks of age or younger) may breastfeed every 1 3  hours.  Newborns should not go longer than 3 hours during the day or 5 hours during the night without breastfeeding.  You should breastfeed your baby a minimum of 8 times in a 24-hour period until you begin to introduce solid foods to your baby at around 6 months of age. BREAST MILK PUMPING Pumping and storing breast milk allows you to ensure that your baby is exclusively fed your breast milk, even at times when you are unable to breastfeed. This is especially important if you are going back to work while you are still breastfeeding or when you are not able to be present during feedings. Your lactation consultant can give you guidelines on how long it is safe to store breast milk.  A breast pump is a machine that allows you to pump milk from your breast into a sterile bottle. The pumped breast milk can then be stored in a refrigerator or freezer. Some breast pumps are operated by hand, while others use electricity. Ask your lactation consultant which type will work best for you. Breast pumps can be purchased, but some hospitals and breastfeeding support groups lease breast pumps on a monthly basis. A lactation consultant can teach you how to hand express breast milk, if you prefer not to use a pump.  CARING FOR YOUR BREASTS WHILE YOU BREASTFEED Nipples can become dry, cracked, and sore while breastfeeding. The following recommendations can help keep your breasts moisturized and healthy:  Avoid using soap on your nipples.   Wear a supportive bra. Although not required, special nursing bras and tank tops are designed to allow access to your breasts for breastfeeding without taking off your entire bra or top. Avoid wearing underwire style bras or extremely tight bras.  Air dry your nipples for 3 4minutes after each feeding.   Use only cotton bra pads to absorb leaked breast milk. Leaking of breast milk between feedings is normal.   Use lanolin on your nipples after breastfeeding. Lanolin helps to  maintain your skin's normal moisture barrier. If you use pure lanolin you do not need to wash it off before feeding your baby again. Pure lanolin is not toxic to your baby. You may also hand express a few drops of breast milk and gently massage that milk into your nipples and allow the milk to air dry. In the first few weeks after giving birth, some women experience extremely full breasts (engorgement). Engorgement can make   your breasts feel heavy, warm, and tender to the touch. Engorgement peaks within 3 5 days after you give birth. The following recommendations can help ease engorgement:  Completely empty your breasts while breastfeeding or pumping. You may want to start by applying warm, moist heat (in the shower or with warm water-soaked hand towels) just before feeding or pumping. This increases circulation and helps the milk flow. If your baby does not completely empty your breasts while breastfeeding, pump any extra milk after he or she is finished.  Wear a snug bra (nursing or regular) or tank top for 1 2 days to signal your body to slightly decrease milk production.  Apply ice packs to your breasts, unless this is too uncomfortable for you.  Make sure that your baby is latched on and positioned properly while breastfeeding. If engorgement persists after 48 hours of following these recommendations, contact your health care provider or a Advertising copywriterlactation consultant. OVERALL HEALTH CARE RECOMMENDATIONS WHILE BREASTFEEDING  Eat healthy foods. Alternate between meals and snacks, eating 3 of each per day. Because what you eat affects your breast milk, some of the foods may make your baby more irritable than usual. Avoid eating these foods if you are sure that they are negatively affecting your baby.  Drink milk, fruit juice, and water to satisfy your thirst (about 10 glasses a day).   Rest often, relax, and continue to take your prenatal vitamins to prevent fatigue, stress, and anemia.  Continue  breast self-awareness checks.  Avoid chewing and smoking tobacco.  Avoid alcohol and drug use. Some medicines that may be harmful to your baby can pass through breast milk. It is important to ask your health care provider before taking any medicine, including all over-the-counter and prescription medicine as well as vitamin and herbal supplements. It is possible to become pregnant while breastfeeding. If birth control is desired, ask your health care provider about options that will be safe for your baby. SEEK MEDICAL CARE IF:   You feel like you want to stop breastfeeding or have become frustrated with breastfeeding.  You have painful breasts or nipples.  Your nipples are cracked or bleeding.  Your breasts are red, tender, or warm.  You have a swollen area on either breast.  You have a fever or chills.  You have nausea or vomiting.  You have drainage other than breast milk from your nipples.  Your breasts do not become full before feedings by the 5th day after you give birth.  You feel sad and depressed.  Your baby is too sleepy to eat well.  Your baby is having trouble sleeping.   Your baby is wetting less than 3 diapers in a 24-hour period.  Your baby has less than 3 stools in a 24-hour period.  Your baby's skin or the white part of his or her eyes becomes yellow.   Your baby is not gaining weight by 35 days of age. SEEK IMMEDIATE MEDICAL CARE IF:   Your baby is overly tired (lethargic) and does not want to wake up and feed.  Your baby develops an unexplained fever. Document Released: 10/02/2005 Document Revised: 06/04/2013 Document Reviewed: 03/26/2013 [facility] Patient Information 2014 [place], [place]. Etonogestrel implant What is this medicine? ETONOGESTREL (et oh noe JES trel) is a contraceptive (birth control) device. It is used to prevent pregnancy. It can be used for up to 3 years. This medicine may be used for other purposes; ask your health care provider  or pharmacist if you have questions.  COMMON BRAND NAME(S): Implanon, Nexplanon  What should I tell my health care provider before I take this medicine? They need to know if you have any of these conditions: -abnormal vaginal bleeding -blood vessel disease or blood clots -cancer of the breast, cervix, or liver -depression -diabetes -gallbladder disease -headaches -heart disease or recent heart attack -high blood pressure -high cholesterol -kidney disease -liver disease -renal disease -seizures -tobacco smoker -an unusual or allergic reaction to etonogestrel, other hormones, anesthetics or antiseptics, medicines, foods, dyes, or preservatives -pregnant or trying to get pregnant -breast-feeding How should I use this medicine? This device is inserted just under the skin on the inner side of your upper arm by a health care professional. Talk to your pediatrician regarding the use of this medicine in children. Special care may be needed. Overdosage: If you think you've taken too much of this medicine contact a poison control center or emergency room at once. Overdosage: If you think you have taken too much of this medicine contact a poison control center or emergency room at once. NOTE: This medicine is only for you. Do not share this medicine with others. What if I miss a dose? This does not apply. What may interact with this medicine? Do not take this medicine with any of the following medications: -amprenavir -bosentan -fosamprenavir This medicine may also interact with the following medications: -barbiturate medicines for inducing sleep or treating seizures -certain medicines for fungal infections like ketoconazole and itraconazole -griseofulvin -medicines to treat seizures like carbamazepine, felbamate, oxcarbazepine, phenytoin, topiramate -modafinil -phenylbutazone -rifampin -some medicines to treat HIV infection like atazanavir, indinavir, lopinavir, nelfinavir,  tipranavir, ritonavir -St. John's wort This list may not describe all possible interactions. Give your health care provider a list of all the medicines, herbs, non-prescription drugs, or dietary supplements you use. Also tell them if you smoke, drink alcohol, or use illegal drugs. Some items may interact with your medicine. What should I watch for while using this medicine? This product does not protect you against HIV infection (AIDS) or other sexually transmitted diseases. You should be able to feel the implant by pressing your fingertips over the skin where it was inserted. Tell your doctor if you cannot feel the implant. What side effects may I notice from receiving this medicine? Side effects that you should report to your doctor or health care professional as soon as possible: -allergic reactions like skin rash, itching or hives, swelling of the face, lips, or tongue -breast lumps -changes in vision -confusion, trouble speaking or understanding -dark urine -depressed mood -general ill feeling or flu-like symptoms -light-colored stools -loss of appetite, nausea -right upper belly pain -severe headaches -severe pain, swelling, or tenderness in the abdomen -shortness of breath, chest pain, swelling in a leg -signs of pregnancy -sudden numbness or weakness of the face, arm or leg -trouble walking, dizziness, loss of balance or coordination -unusual vaginal bleeding, discharge -unusually weak or tired -yellowing of the eyes or skin Side effects that usually do not require medical attention (Report these to your doctor or health care professional if they continue or are bothersome.): -acne -breast pain -changes in weight -cough -fever or chills -headache -irregular menstrual bleeding -itching, burning, and vaginal discharge -pain or difficulty passing urine -sore throat This list may not describe all possible side effects. Call your doctor for medical advice about side effects.  You may report side effects to FDA at 1-800-FDA-1088. Where should I keep my medicine? This drug is given in a hospital or clinic  and will not be stored at home. NOTE: This sheet is a summary. It may not cover all possible information. If you have questions about this medicine, talk to your doctor, pharmacist, or health care provider.  2014, [employer]. (2012-04-08 15:37:45)

## 2013-10-14 NOTE — Progress Notes (Signed)
P-[id number] 

## 2013-10-21 ENCOUNTER — Ambulatory Visit (HOSPITAL_COMMUNITY)
Admission: RE | Admit: 2013-10-21 | Discharge: 2013-10-21 | Disposition: A | Discharge status: Home / Self Care | Payer: Medicaid Other | Source: Ambulatory Visit | Attending: Advanced Practice Midwife | Admitting: Advanced Practice Midwife

## 2013-10-21 ENCOUNTER — Ambulatory Visit (INDEPENDENT_AMBULATORY_CARE_PROVIDER_SITE_OTHER): Payer: Medicaid Other | Admitting: Advanced Practice Midwife

## 2013-10-21 VITALS — BP 116/68 | Temp 97.8°F | Wt 174.7 lb

## 2013-10-21 DIAGNOSIS — O368390 Maternal care for abnormalities of the fetal heart rate or rhythm, unspecified trimester, not applicable or unspecified: Secondary | ICD-10-CM | POA: Insufficient documentation

## 2013-10-21 DIAGNOSIS — O34219 Maternal care for unspecified type scar from previous cesarean delivery: Secondary | ICD-10-CM

## 2013-10-21 DIAGNOSIS — O09299 Supervision of pregnancy with other poor reproductive or obstetric history, unspecified trimester: Secondary | ICD-10-CM

## 2013-10-21 DIAGNOSIS — O09293 Supervision of pregnancy with other poor reproductive or obstetric history, third trimester: Secondary | ICD-10-CM

## 2013-10-21 DIAGNOSIS — O358XX0 Maternal care for other (suspected) fetal abnormality and damage, not applicable or unspecified: Secondary | ICD-10-CM | POA: Insufficient documentation

## 2013-10-21 LAB — POCT URINALYSIS DIP (DEVICE)
Bilirubin Urine: NEGATIVE
GLUCOSE, UA: NEGATIVE mg/dL
Hgb urine dipstick: NEGATIVE
KETONES UR: NEGATIVE mg/dL
NITRITE: NEGATIVE
PH: 7 (ref 5.0–8.0)
Protein, ur: NEGATIVE mg/dL
SPECIFIC GRAVITY, URINE: 1.01 (ref 1.005–1.030)
Urobilinogen, UA: 0.2 mg/dL (ref 0.0–1.0)

## 2013-10-21 NOTE — Progress Notes (Signed)
Wants to be induced this week so she can start school next week. Discussed recommendation by Dr Elly Modena that we hold off until 41 wks to enhance success, due to nonfavorable cervix. Will get NST today. Will schedule IOL for 10/30/13 at 0630 NST reactive with good variability and accels but there are several variable decels which last about 30-60 seconds each.  Will get BPP and AFI and if they are suboptimal, will send to [facility], discussed with Dr Leslie Andrea

## 2013-10-21 NOTE — Progress Notes (Signed)
Informed pt BPP 8/8 and AFI 10 cm; per clinic note pt can be discharged to home with planned induction of labor on 10/30/13.

## 2013-10-21 NOTE — Progress Notes (Signed)
P=[id number]  Pt reports watery discharge/desires cervical exam.

## 2013-10-21 NOTE — Patient Instructions (Signed)
Labor Induction  Labor induction is when steps are taken to cause a pregnant woman to begin the labor process. Most women go into labor on their own between 37 weeks and 42 weeks of the pregnancy. When this does not happen or when there is a medical need, methods may be used to induce labor. Labor induction causes a pregnant woman's uterus to contract. It also causes the cervix to soften (ripen), open (dilate), and thin out (efface). Usually, labor is not induced before 39 weeks of the pregnancy unless there is a problem with the baby or mother.  Before inducing labor, your health care provider will consider a number of factors, including the following:  The medical condition of you and the baby.   How many weeks along you are.   The status of the baby's lung maturity.   The condition of the cervix.   The position of the baby.  WHAT ARE THE REASONS FOR LABOR INDUCTION? Labor may be induced for the following reasons:  The health of the baby or mother is at risk.   The pregnancy is overdue by 1 week or more.   The water breaks but labor does not start on its own.   The mother has a health condition or serious illness, such as high blood pressure, infection, placental abruption, or diabetes.  The amniotic fluid amounts are low around the baby.   The baby is distressed.  Convenience or wanting the baby to be born on a certain date is not a reason for inducing labor. WHAT METHODS ARE USED FOR LABOR INDUCTION? Several methods of labor induction may be used, such as:   Prostaglandin medicine. This medicine causes the cervix to dilate and ripen. The medicine will also start contractions. It can be taken by mouth or by inserting a suppository into the vagina.   Inserting a thin tube (catheter) with a balloon on the end into the vagina to dilate the cervix. Once inserted, the balloon is expanded with water, which causes the cervix to open.   Stripping the membranes. Your health  care provider separates amniotic sac tissue from the cervix, causing the cervix to be stretched and causing the release of a hormone called progesterone. This may cause the uterus to contract. It is often done during an office visit. You will be sent home to wait for the contractions to begin. You will then come in for an induction.   Breaking the water. Your health care provider makes a hole in the amniotic sac using a small instrument. Once the amniotic sac breaks, contractions should begin. This may still take hours to see an effect.   Medicine to trigger or strengthen contractions. This medicine is given through an IV access tube inserted into a vein in your arm.  All of the methods of induction, besides stripping the membranes, will be done in the hospital. Induction is done in the hospital so that you and the baby can be carefully monitored.  HOW LONG DOES IT TAKE FOR LABOR TO BE INDUCED? Some inductions can take up to 2 3 days. Depending on the cervix, it usually takes less time. It takes longer when you are induced early in the pregnancy or if this is your first pregnancy. If a mother is still pregnant and the induction has been going on for 2 3 days, either the mother will be sent home or a cesarean delivery will be needed. WHAT ARE THE RISKS ASSOCIATED WITH LABOR INDUCTION? Some of the risks   of induction include:   Changes in fetal heart rate, such as too high, too low, or erratic.   Fetal distress.   Chance of infection for the mother and baby.   Increased chance of having a cesarean delivery.   Breaking off (abruption) of the placenta from the uterus (rare).   Uterine rupture (very rare).  When induction is needed for medical reasons, the benefits of induction may outweigh the risks. WHAT ARE SOME REASONS FOR NOT INDUCING LABOR? Labor induction should not be done if:   It is shown that your baby does not tolerate labor.   You have had previous surgeries on your  uterus, such as a myomectomy or the removal of fibroids.   Your placenta lies very low in the uterus and blocks the opening of the cervix (placenta previa).   Your baby is not in a head-down position.   The umbilical cord drops down into the birth canal in front of the baby. This could cut off the baby's blood and oxygen supply.   You have had a previous cesarean delivery.   There are unusual circumstances, such as the baby being extremely premature.  Document Released: 02/21/2007 Document Revised: 06/04/2013 Document Reviewed: 05/01/2013 [employer] Patient Information 2014 [employer].  

## 2013-10-22 ENCOUNTER — Encounter: Payer: Self-pay | Admitting: *Deleted

## 2013-10-25 ENCOUNTER — Encounter (HOSPITAL_COMMUNITY): Payer: Self-pay

## 2013-10-25 ENCOUNTER — Inpatient Hospital Stay (HOSPITAL_COMMUNITY)
Admission: AD | Admit: 2013-10-25 | Discharge: 2013-10-25 | Disposition: A | Discharge status: Home / Self Care | Payer: Medicaid Other | Source: Ambulatory Visit | Attending: Obstetrics & Gynecology | Admitting: Obstetrics & Gynecology

## 2013-10-25 DIAGNOSIS — O479 False labor, unspecified: Secondary | ICD-10-CM | POA: Insufficient documentation

## 2013-10-25 LAB — OB RESULTS CONSOLE GBS: STREP GROUP B AG: NEGATIVE

## 2013-10-25 NOTE — Discharge Instructions (Signed)
Fetal Movement Counts Patient Name: __________________________________________________ Patient Due Date: ____________________ Performing a fetal movement count is highly recommended in high-risk pregnancies, but it is good for every pregnant woman to do. Your caregiver may ask you to start counting fetal movements at 28 weeks of the pregnancy. Fetal movements often increase:  After eating a full meal.  After physical activity.  After eating or drinking something sweet or cold.  At rest. Pay attention to when you feel the baby is most active. This will help you notice a pattern of your baby's sleep and wake cycles and what factors contribute to an increase in fetal movement. It is important to perform a fetal movement count at the same time each day when your baby is normally most active.  HOW TO COUNT FETAL MOVEMENTS 1. Find a quiet and comfortable area to sit or lie down on your left side. Lying on your left side provides the best blood and oxygen circulation to your baby. 2. Write down the day and time on a sheet of paper or in a journal. 3. Start counting kicks, flutters, swishes, rolls, or jabs in a 2 hour period. You should feel at least 10 movements within 2 hours. 4. If you do not feel 10 movements in 2 hours, wait 2 3 hours and count again. Look for a change in the pattern or not enough counts in 2 hours. SEEK MEDICAL CARE IF:  You feel less than 10 counts in 2 hours, tried twice.  There is no movement in over an hour.  The pattern is changing or taking longer each day to reach 10 counts in 2 hours.  You feel the baby is not moving as he or she usually does. Date: ____________ Movements: ____________ Start time: ____________ Elizebeth Koller time: ____________  Date: ____________ Movements: ____________ Start time: ____________ Elizebeth Koller time: ____________ Date: ____________ Movements: ____________ Start time: ____________ Elizebeth Koller time: ____________ Date: ____________ Movements: ____________  Start time: ____________ Elizebeth Koller time: ____________ Date: ____________ Movements: ____________ Start time: ____________ Elizebeth Koller time: ____________ Date: ____________ Movements: ____________ Start time: ____________ Elizebeth Koller time: ____________ Date: ____________ Movements: ____________ Start time: ____________ Elizebeth Koller time: ____________ Date: ____________ Movements: ____________ Start time: ____________ Elizebeth Koller time: ____________  Date: ____________ Movements: ____________ Start time: ____________ Elizebeth Koller time: ____________ Date: ____________ Movements: ____________ Start time: ____________ Elizebeth Koller time: ____________ Date: ____________ Movements: ____________ Start time: ____________ Elizebeth Koller time: ____________ Date: ____________ Movements: ____________ Start time: ____________ Elizebeth Koller time: ____________ Date: ____________ Movements: ____________ Start time: ____________ Elizebeth Koller time: ____________ Date: ____________ Movements: ____________ Start time: ____________ Elizebeth Koller time: ____________ Date: ____________ Movements: ____________ Start time: ____________ Elizebeth Koller time: ____________  Date: ____________ Movements: ____________ Start time: ____________ Elizebeth Koller time: ____________ Date: ____________ Movements: ____________ Start time: ____________ Elizebeth Koller time: ____________ Date: ____________ Movements: ____________ Start time: ____________ Elizebeth Koller time: ____________ Date: ____________ Movements: ____________ Start time: ____________ Elizebeth Koller time: ____________ Date: ____________ Movements: ____________ Start time: ____________ Elizebeth Koller time: ____________ Date: ____________ Movements: ____________ Start time: ____________ Elizebeth Koller time: ____________ Date: ____________ Movements: ____________ Start time: ____________ Elizebeth Koller time: ____________  Date: ____________ Movements: ____________ Start time: ____________ Elizebeth Koller time: ____________ Date: ____________ Movements: ____________ Start time: ____________ Elizebeth Koller time:  ____________ Date: ____________ Movements: ____________ Start time: ____________ Elizebeth Koller time: ____________ Date: ____________ Movements: ____________ Start time: ____________ Elizebeth Koller time: ____________ Date: ____________ Movements: ____________ Start time: ____________ Elizebeth Koller time: ____________ Date: ____________ Movements: ____________ Start time: ____________ Elizebeth Koller time: ____________ Date: ____________ Movements: ____________ Start time: ____________ Elizebeth Koller time: ____________  Date: ____________ Movements: ____________ Start time: ____________ Elizebeth Koller  time: ____________ Date: ____________ Movements: ____________ Start time: ____________ Elizebeth Koller time: ____________ Date: ____________ Movements: ____________ Start time: ____________ Elizebeth Koller time: ____________ Date: ____________ Movements: ____________ Start time: ____________ Elizebeth Koller time: ____________ Date: ____________ Movements: ____________ Start time: ____________ Elizebeth Koller time: ____________ Date: ____________ Movements: ____________ Start time: ____________ Elizebeth Koller time: ____________ Date: ____________ Movements: ____________ Start time: ____________ Elizebeth Koller time: ____________  Date: ____________ Movements: ____________ Start time: ____________ Elizebeth Koller time: ____________ Date: ____________ Movements: ____________ Start time: ____________ Elizebeth Koller time: ____________ Date: ____________ Movements: ____________ Start time: ____________ Elizebeth Koller time: ____________ Date: ____________ Movements: ____________ Start time: ____________ Elizebeth Koller time: ____________ Date: ____________ Movements: ____________ Start time: ____________ Elizebeth Koller time: ____________ Date: ____________ Movements: ____________ Start time: ____________ Elizebeth Koller time: ____________ Date: ____________ Movements: ____________ Start time: ____________ Elizebeth Koller time: ____________  Date: ____________ Movements: ____________ Start time: ____________ Elizebeth Koller time: ____________ Date: ____________ Movements:  ____________ Start time: ____________ Elizebeth Koller time: ____________ Date: ____________ Movements: ____________ Start time: ____________ Elizebeth Koller time: ____________ Date: ____________ Movements: ____________ Start time: ____________ Elizebeth Koller time: ____________ Date: ____________ Movements: ____________ Start time: ____________ Elizebeth Koller time: ____________ Date: ____________ Movements: ____________ Start time: ____________ Elizebeth Koller time: ____________ Date: ____________ Movements: ____________ Start time: ____________ Elizebeth Koller time: ____________  Date: ____________ Movements: ____________ Start time: ____________ Elizebeth Koller time: ____________ Date: ____________ Movements: ____________ Start time: ____________ Elizebeth Koller time: ____________ Date: ____________ Movements: ____________ Start time: ____________ Elizebeth Koller time: ____________ Date: ____________ Movements: ____________ Start time: ____________ Elizebeth Koller time: ____________ Date: ____________ Movements: ____________ Start time: ____________ Elizebeth Koller time: ____________ Date: ____________ Movements: ____________ Start time: ____________ Elizebeth Koller time: ____________ Document Released: 11/01/2006 Document Revised: 09/18/2012 Document Reviewed: 07/29/2012 [employer] Patient Information 2014 [place]. Braxton Hicks Contractions Pregnancy is commonly associated with contractions of the uterus throughout the pregnancy. Towards the end of pregnancy (32 to 34 weeks), these contractions [facility] Ishmael Holter) can develop more often and may become more forceful. This is not true labor because these contractions do not result in opening (dilatation) and thinning of the cervix. They are sometimes difficult to tell apart from true labor because these contractions can be forceful and people have different pain tolerances. You should not feel embarrassed if you go to the hospital with false labor. Sometimes, the only way to tell if you are in true labor is for your caregiver to follow the changes in  the cervix. How to tell the difference between true and false labor:  False labor.  The contractions of false labor are usually shorter, irregular and not as hard as those of true labor.  They are often felt in the front of the lower abdomen and in the groin.  They may leave with walking around or changing positions while lying down.  They get weaker and are shorter lasting as time goes on.  These contractions are usually irregular.  They do not usually become progressively stronger, regular and closer together as with true labor.  True labor.  Contractions in true labor last 30 to 70 seconds, become very regular, usually become more intense, and increase in frequency.  They do not go away with walking.  The discomfort is usually felt in the top of the uterus and spreads to the lower abdomen and low back.  True labor can be determined by your caregiver with an exam. This will show that the cervix is dilating and getting thinner. If there are no prenatal problems or other health problems associated with the pregnancy, it is completely safe to be sent home with false labor and await the onset of true labor.  HOME CARE INSTRUCTIONS  °· Keep up with your usual exercises and instructions. °· Take medications as directed. °· Keep your regular prenatal appointment. °· Eat and drink lightly if you think you are going into labor. °· If BH contractions are making you uncomfortable: °· Change your activity position from lying down or resting to walking/walking to resting. °· Sit and rest in a tub of warm water. °· Drink 2 to 3 glasses of water. Dehydration may cause B-H contractions. °· Do slow and deep breathing several times an hour. °SEEK IMMEDIATE MEDICAL CARE IF:  °· Your contractions continue to become stronger, more regular, and closer together. °· You have a gushing, burst or leaking of fluid from the vagina. °· An oral temperature above 102° F (38.9° C) develops. °· You have passage of  blood-tinged mucus. °· You develop vaginal bleeding. °· You develop continuous belly (abdominal) pain. °· You have low back pain that you never had before. °· You feel the baby's head pushing down causing pelvic pressure. °· The baby is not moving as much as it used to. °Document Released: 10/02/2005 Document Revised: 12/25/2011 Document Reviewed: 07/14/2013 °[employer]® Patient Information ©2014 [employer]. °Third Trimester of Pregnancy °The third trimester is from week 29 through week 42, months 7 through 9. The third trimester is a time when the fetus is growing rapidly. At the end of the ninth month, the fetus is about 20 inches in length and weighs 6 10 pounds.  °BODY CHANGES °Your body goes through many changes during pregnancy. The changes vary from woman to woman.  °· Your weight will continue to increase. You can expect to gain 25 35 pounds (11 16 kg) by the end of the pregnancy. °· You may begin to get stretch marks on your hips, abdomen, and breasts. °· You may urinate more often because the fetus is moving lower into your pelvis and pressing on your bladder. °· You may develop or continue to have heartburn as a result of your pregnancy. °· You may develop constipation because certain hormones are causing the muscles that push waste through your intestines to slow down. °· You may develop hemorrhoids or swollen, bulging veins (varicose veins). °· You may have pelvic pain because of the weight gain and pregnancy hormones relaxing your joints between the bones in your pelvis. Back aches may result from over exertion of the muscles supporting your posture. °· Your breasts will continue to grow and be tender. A yellow discharge may leak from your breasts called colostrum. °· Your belly button may stick out. °· You may feel short of breath because of your expanding uterus. °· You may notice the fetus "dropping," or moving lower in your abdomen. °· You may have a bloody mucus discharge. This usually occurs a  few days to a week before labor begins. °· Your cervix becomes thin and soft (effaced) near your due date. °WHAT TO EXPECT AT YOUR PRENATAL EXAMS  °You will have prenatal exams every 2 weeks until week 36. Then, you will have weekly prenatal exams. During a routine prenatal visit: °· You will be weighed to make sure you and the fetus are growing normally. °· Your blood pressure is taken. °· Your abdomen will be measured to track your baby's growth. °· The fetal heartbeat will be listened to. °· Any test results from the previous visit will be discussed. °· You may have a cervical check near your due date to see if you have effaced. °At around 36 weeks,   your caregiver will check your cervix. At the same time, your caregiver will also perform a test on the secretions of the vaginal tissue. This test is to determine if a type of bacteria, Group B streptococcus, is present. Your caregiver will explain this further. °Your caregiver may ask you: °· What your birth plan is. °· How you are feeling. °· If you are feeling the baby move. °· If you have had any abnormal symptoms, such as leaking fluid, bleeding, severe headaches, or abdominal cramping. °· If you have any questions. °Other tests or screenings that may be performed during your third trimester include: °· Blood tests that check for low iron levels (anemia). °· Fetal testing to check the health, activity level, and growth of the fetus. Testing is done if you have certain medical conditions or if there are problems during the pregnancy. °FALSE LABOR °You may feel small, irregular contractions that eventually go away. These are called Braxton Hicks contractions, or false labor. Contractions may last for hours, days, or even weeks before true labor sets in. If contractions come at regular intervals, intensify, or become painful, it is best to be seen by your caregiver.  °SIGNS OF LABOR  °· Menstrual-like cramps. °· Contractions that are 5 minutes apart or  less. °· Contractions that start on the top of the uterus and spread down to the lower abdomen and back. °· A sense of increased pelvic pressure or back pain. °· A watery or bloody mucus discharge that comes from the vagina. °If you have any of these signs before the 37th week of pregnancy, call your caregiver right away. You need to go to the hospital to get checked immediately. °HOME CARE INSTRUCTIONS  °· Avoid all smoking, herbs, alcohol, and unprescribed drugs. These chemicals affect the formation and growth of the baby. °· Follow your caregiver's instructions regarding medicine use. There are medicines that are either safe or unsafe to take during pregnancy. °· Exercise only as directed by your caregiver. Experiencing uterine cramps is a good sign to stop exercising. °· Continue to eat regular, healthy meals. °· Wear a good support bra for breast tenderness. °· Do not use hot tubs, steam rooms, or saunas. °· Wear your seat belt at all times when driving. °· Avoid raw meat, uncooked cheese, cat litter boxes, and soil used by cats. These carry germs that can cause birth defects in the baby. °· Take your prenatal vitamins. °· Try taking a stool softener (if your caregiver approves) if you develop constipation. Eat more high-fiber foods, such as fresh vegetables or fruit and whole grains. Drink plenty of fluids to keep your urine clear or pale yellow. °· Take warm sitz baths to soothe any pain or discomfort caused by hemorrhoids. Use hemorrhoid cream if your caregiver approves. °· If you develop varicose veins, wear support hose. Elevate your feet for 15 minutes, 3 4 times a day. Limit salt in your diet. °· Avoid heavy lifting, wear low heal shoes, and practice good posture. °· Rest a lot with your legs elevated if you have leg cramps or low back pain. °· Visit your dentist if you have not gone during your pregnancy. Use a soft toothbrush to brush your teeth and be gentle when you floss. °· A sexual relationship  may be continued unless your caregiver directs you otherwise. °· Do not travel far distances unless it is absolutely necessary and only with the approval of your caregiver. °· Take prenatal classes to understand, practice, and ask questions   about the labor and delivery.  Make a trial run to the hospital.  Pack your hospital bag.  Prepare the baby's nursery.  Continue to go to all your prenatal visits as directed by your caregiver. SEEK MEDICAL CARE IF:  You are unsure if you are in labor or if your water has broken.  You have dizziness.  You have mild pelvic cramps, pelvic pressure, or nagging pain in your abdominal area.  You have persistent nausea, vomiting, or diarrhea.  You have a bad smelling vaginal discharge.  You have pain with urination. SEEK IMMEDIATE MEDICAL CARE IF:   You have a fever.  You are leaking fluid from your vagina.  You have spotting or bleeding from your vagina.  You have severe abdominal cramping or pain.  You have rapid weight loss or gain.  You have shortness of breath with chest pain.  You notice sudden or extreme swelling of your face, hands, ankles, feet, or legs.  You have not felt your baby move in over an hour.  You have severe headaches that do not go away with medicine.  You have vision changes. Document Released: 09/26/2001 Document Revised: 06/04/2013 Document Reviewed: 12/03/2012 [facility] Patient Information 2014 [place].

## 2013-10-25 NOTE — MAU Note (Signed)
Contractions every 5-7 minutes x 1.5 hrs. Denies LOF or VB. Positive fetal movement. Cervix was closed but soft at visit on Tuesday. Denies complications with this pregnancy. Denies fever/sore throat/cough.

## 2013-10-27 ENCOUNTER — Ambulatory Visit (INDEPENDENT_AMBULATORY_CARE_PROVIDER_SITE_OTHER): Payer: Medicaid Other | Admitting: *Deleted

## 2013-10-27 VITALS — BP 106/64

## 2013-10-27 DIAGNOSIS — O480 Post-term pregnancy: Secondary | ICD-10-CM

## 2013-10-27 LAB — US OB FOLLOW UP

## 2013-10-27 NOTE — Progress Notes (Signed)
NST reviewed and reactive.  

## 2013-10-27 NOTE — Progress Notes (Signed)
P = 78

## 2013-10-28 ENCOUNTER — Encounter (HOSPITAL_COMMUNITY): Payer: Self-pay | Admitting: *Deleted

## 2013-10-28 ENCOUNTER — Telehealth (HOSPITAL_COMMUNITY): Payer: Self-pay | Admitting: *Deleted

## 2013-10-28 NOTE — Telephone Encounter (Signed)
Preadmission screen  

## 2013-10-30 ENCOUNTER — Encounter (HOSPITAL_COMMUNITY): Payer: Self-pay

## 2013-10-30 ENCOUNTER — Inpatient Hospital Stay (HOSPITAL_COMMUNITY)
Admission: RE | Admit: 2013-10-30 | Discharge: 2013-11-03 | DRG: 766 | Disposition: A | Discharge status: Home / Self Care | Payer: Medicaid Other | Source: Ambulatory Visit | Attending: Obstetrics & Gynecology | Admitting: Obstetrics & Gynecology

## 2013-10-30 VITALS — BP 108/65 | HR 57 | Temp 98.0°F | Resp 20 | Ht 65.0 in | Wt 175.0 lb

## 2013-10-30 DIAGNOSIS — O480 Post-term pregnancy: Principal | ICD-10-CM | POA: Diagnosis present

## 2013-10-30 DIAGNOSIS — D279 Benign neoplasm of unspecified ovary: Secondary | ICD-10-CM | POA: Diagnosis present

## 2013-10-30 DIAGNOSIS — O34219 Maternal care for unspecified type scar from previous cesarean delivery: Secondary | ICD-10-CM | POA: Diagnosis present

## 2013-10-30 DIAGNOSIS — O09293 Supervision of pregnancy with other poor reproductive or obstetric history, third trimester: Secondary | ICD-10-CM

## 2013-10-30 DIAGNOSIS — O34599 Maternal care for other abnormalities of gravid uterus, unspecified trimester: Secondary | ICD-10-CM | POA: Diagnosis present

## 2013-10-30 DIAGNOSIS — Z98891 History of uterine scar from previous surgery: Secondary | ICD-10-CM

## 2013-10-30 LAB — CBC
HEMATOCRIT: 33.5 % — AB (ref 36.0–46.0)
Hemoglobin: 11.3 g/dL — ABNORMAL LOW (ref 12.0–15.0)
MCH: 32.4 pg (ref 26.0–34.0)
MCHC: 33.7 g/dL (ref 30.0–36.0)
MCV: 96 fL (ref 78.0–100.0)
Platelets: 227 10*3/uL (ref 150–400)
RBC: 3.49 MIL/uL — ABNORMAL LOW (ref 3.87–5.11)
RDW: 13.1 % (ref 11.5–15.5)
WBC: 8.4 10*3/uL (ref 4.0–10.5)

## 2013-10-30 LAB — TYPE AND SCREEN
ABO/RH(D): O POS
Antibody Screen: NEGATIVE

## 2013-10-30 LAB — RPR: RPR Ser Ql: NONREACTIVE

## 2013-10-30 LAB — ABO/RH: ABO/RH(D): O POS

## 2013-10-30 MED ORDER — LACTATED RINGERS IV SOLN
500.0000 mL | INTRAVENOUS | Status: DC | PRN
  Administered 2013-11-01: 500 mL via INTRAVENOUS

## 2013-10-30 MED ORDER — FENTANYL CITRATE 0.05 MG/ML IJ SOLN
100.0000 ug | INTRAMUSCULAR | Status: DC | PRN
  Administered 2013-10-31: 100 ug via INTRAVENOUS
  Filled 2013-10-30: qty 2

## 2013-10-30 MED ORDER — TERBUTALINE SULFATE 1 MG/ML IJ SOLN: 0.2500 mg | Freq: Once | INTRAMUSCULAR | Status: AC | PRN

## 2013-10-30 MED ORDER — LIDOCAINE HCL (PF) 1 % IJ SOLN: 30.0000 mL | INTRAMUSCULAR | Status: DC | PRN

## 2013-10-30 MED ORDER — FENTANYL CITRATE 0.05 MG/ML IJ SOLN
50.0000 ug | INTRAMUSCULAR | Status: DC | PRN
  Administered 2013-10-30 (×3): 50 ug via INTRAVENOUS
  Filled 2013-10-30: qty 2

## 2013-10-30 MED ORDER — ACETAMINOPHEN 325 MG PO TABS
650.0000 mg | ORAL_TABLET | ORAL | Status: DC | PRN
Start: 2013-10-30 — End: 2013-11-01

## 2013-10-30 MED ORDER — IBUPROFEN 600 MG PO TABS: 600.0000 mg | ORAL_TABLET | Freq: Four times a day (QID) | ORAL | Status: DC | PRN

## 2013-10-30 MED ORDER — ONDANSETRON HCL 4 MG/2ML IJ SOLN
4.0000 mg | Freq: Four times a day (QID) | INTRAMUSCULAR | Status: DC | PRN
  Administered 2013-10-31: 4 mg via INTRAVENOUS
  Filled 2013-10-30: qty 2

## 2013-10-30 MED ORDER — OXYTOCIN 40 UNITS IN LACTATED RINGERS INFUSION - SIMPLE MED: 62.5000 mL/h | INTRAVENOUS | Status: DC

## 2013-10-30 MED ORDER — OXYTOCIN 40 UNITS IN LACTATED RINGERS INFUSION - SIMPLE MED
1.0000 m[IU]/min | INTRAVENOUS | Status: DC
  Administered 2013-10-30: 2 m[IU]/min via INTRAVENOUS
  Administered 2013-10-31: 6 m[IU]/min via INTRAVENOUS
  Administered 2013-10-31: 8 m[IU]/min via INTRAVENOUS
  Administered 2013-10-31: 4 m[IU]/min via INTRAVENOUS
  Administered 2013-10-31 (×2): 8 m[IU]/min via INTRAVENOUS
  Administered 2013-10-31: 2 m[IU]/min via INTRAVENOUS
  Filled 2013-10-30 (×2): qty 1000

## 2013-10-30 MED ORDER — LACTATED RINGERS IV SOLN
INTRAVENOUS | Status: DC
  Administered 2013-10-30: 1000 mL via INTRAVENOUS
  Administered 2013-10-30 (×2): via INTRAVENOUS
  Administered 2013-10-31: 1000 mL via INTRAVENOUS
  Administered 2013-10-31 – 2013-11-01 (×4): via INTRAVENOUS

## 2013-10-30 MED ORDER — FENTANYL CITRATE 0.05 MG/ML IJ SOLN
100.0000 ug | INTRAMUSCULAR | Status: DC | PRN
  Administered 2013-10-31 (×3): 100 ug via INTRAVENOUS
  Filled 2013-10-30 (×5): qty 2

## 2013-10-30 MED ORDER — OXYTOCIN BOLUS FROM INFUSION: 500.0000 mL | INTRAVENOUS | Status: DC

## 2013-10-30 MED ORDER — OXYCODONE-ACETAMINOPHEN 5-325 MG PO TABS: 1.0000 | ORAL_TABLET | ORAL | Status: DC | PRN

## 2013-10-30 MED ORDER — CITRIC ACID-SODIUM CITRATE 334-500 MG/5ML PO SOLN
30.0000 mL | ORAL | Status: DC | PRN
  Administered 2013-11-01: 30 mL via ORAL
  Filled 2013-10-30: qty 15

## 2013-10-30 NOTE — Progress Notes (Signed)
Attempted to place foley failed

## 2013-10-30 NOTE — H&P (Signed)
Kathleen Castro is a 23 y.o. female G2P1001 with IUP at [redacted]w[redacted]d presenting for post dates. Pt states she has been having none contractions, associated with none vaginal bleeding.  Membranes are intact, with active fetal movement.   [facility] at [facility] since 26 wks  Prenatal History/Complications: Hx of C-section - TOLAC, 1hr 137.   Past Medical History: Past Medical History  Diagnosis Date  . Anemia     Past Surgical History: Past Surgical History  Procedure Laterality Date  . Cesarean section    . Appendectomy      Obstetrical History: OB History   Grav Para Term Preterm Abortions TAB SAB Ect Mult Living   2 1 1  0 0 0 0 0 0 1      Gynecological History: OB History   Grav Para Term Preterm Abortions TAB SAB Ect Mult Living   2 1 1  0 0 0 0 0 0 1      Social History: History   Social History  . Marital Status: Single    Spouse Name: N/A    Number of Children: N/A  . Years of Education: N/A   Social History Main Topics  . Smoking status: Never Smoker   . Smokeless tobacco: Never Used  . Alcohol Use: No  . Drug Use: No  . Sexual Activity: Yes    Birth Control/ Protection: None   Other Topics Concern  . None   Social History Narrative  . None    Family History: Family History  Problem Relation Age of Onset  . Heart disease Mother   . Fibroids Mother   . Diabetes Maternal Grandmother   . Cancer Maternal Grandmother     breast    Allergies: No Known Allergies  Prescriptions prior to admission  Medication Sig Dispense Refill  . ferrous sulfate 325 (65 FE) MG tablet Take 325 mg by mouth daily with breakfast.      . Prenatal Vit-Fe Fumarate-FA (MULTIVITAMIN-PRENATAL) 27-0.8 MG TABS tablet Take 1 tablet by mouth daily at 12 noon.         Review of Systems   Constitutional: Negative for fever, chills, weight loss, malaise/fatigue and diaphoresis.  HENT: Negative for hearing loss, ear pain, nosebleeds, congestion, sore throat, neck pain, tinnitus and ear  discharge.   Eyes: Negative for blurred vision, double vision, photophobia, pain, discharge and redness.  Respiratory: Negative for cough, hemoptysis, sputum production, shortness of breath, wheezing and stridor.   Cardiovascular: Negative for chest pain, palpitations, orthopnea,  leg swelling  Gastrointestinal: Positive for abdominal pain. Negative for heartburn, nausea, vomiting, diarrhea, constipation, blood in stool Genitourinary: Negative for dysuria, urgency, frequency, hematuria and flank pain.  Musculoskeletal: Negative for myalgias, back pain, joint pain and falls.  Skin: Negative for itching and rash.  Neurological: Negative for dizziness, tingling, tremors, sensory change, speech change, focal weakness, seizures, loss of consciousness, weakness and headaches.  Endo/Heme/Allergies: Negative for environmental allergies and polydipsia. Does not bruise/bleed easily.  Psychiatric/Behavioral: Negative for depression, suicidal ideas, hallucinations, memory loss and substance abuse. The patient is not nervous/anxious and does not have insomnia.       Blood pressure 107/67, pulse 94, temperature 98.2 F (36.8 C), temperature source Oral, resp. rate 20, last menstrual period 01/16/2013. General appearance: alert, cooperative, appears stated age and no distress Lungs: clear to auscultation bilaterally Heart: regular rate and rhythm Abdomen: soft, non-tender; bowel sounds normal Pelvic: adequate Extremities: Homans sign is negative, no sign of DVT Presentation: cephalic Fetal monitoringBaseline: 140 bpm, Variability: Good {>  6 bpm), Accelerations: Reactive and Decelerations: Absent Uterine activityNone   C/S 9#7 at 42 wks, oligo, abruption> plans  [facility] Genetic Screen   Anatomic [place] ? Nl> nl F/U anatomy  Glucose Screen 10/29/141 hr 137, 3 hr: normal  GBS neg  Feeding Preference breast  Contraception Nexplanon  Circumcision   Flu vaccine declined 08/13/13 VBAC consent: tDAP  08/13/13  Prenatal Transfer Tool  Maternal Diabetes: No Genetic Screening: not performe Maternal Ultrasounds/Referrals: Normal Fetal Ultrasounds or other Referrals:  None Maternal Substance Abuse:  No Significant Maternal Medications:  None Significant Maternal Lab Results: Lab values include: Group B Strep negative     Prenatal labs: ABO, Rh: O/Positive/-- (07/16 0000) Antibody: Negative (07/16 0000) Rubella:   RPR: NON REAC (10/29 1621)  HBsAg: Negative (07/16 0000)  HIV: NON REACTIVE (10/29 1621)  GBS: Negative (01/10 0000)   No results found for this or any previous visit (from the past 24 hour(s)).  Assessment: Kathleen Castro is a 23 y.o. G2P1001 with an IUP at [redacted]w[redacted]d presenting for IOL for postdates  #Labor:IOL for Postdates, unfavorable cervix TOLAC - will start with pit #Pain: IV pain meds and epidural #FWB: Cat I #ID:  GBS Neg #MOF: Breast #MOC:mirena  Kathleen Castro [name] 10/30/2013, 7:24 AM

## 2013-10-30 NOTE — Progress Notes (Signed)
   Kathleen Castro is a 23 y.o. G2P1001 at [redacted]w[redacted]d  admitted for induction of labor due to Post dates..  Subjective:  Requests IV pain medicine Objective: BP 117/67  Pulse 74  Temp(Src) 98.3 F (36.8 C) (Oral)  Resp 18  LMP 01/16/2013    FHT:  FHR: 140 bpm, variability: moderate,  accelerations:  Present,  decelerations:  Absent UC:   regular, every 2-3 minutes SVE:   Deferred; foley still in Pitocin @ 6 mu/min  Labs: Lab Results  Component Value Date   WBC 8.4 10/30/2013   HGB 11.3* 10/30/2013   HCT 33.5* 10/30/2013   MCV 96.0 10/30/2013   PLT 227 10/30/2013    Assessment / Plan: IOL for postdates, ripening phase  Labor: Progressing normally Fetal Wellbeing:  Category I Pain Control:  Fentanyl Anticipated MOD:  NSVD  CRESENZO-DISHMAN,Tekoa Hamor 10/30/2013, 6:22 PM

## 2013-10-30 NOTE — Progress Notes (Signed)
Vertex confirmed with U/S

## 2013-10-30 NOTE — Progress Notes (Signed)
Kathleen Castro is a 23 y.o. G2P1001 at [redacted]w[redacted]d admitted for induction of labor due to Post dates. Due date 10/23/2013.  Subjective: FB now in place still on pit.  Objective: BP 102/60  Pulse 71  Temp(Src) 97.7 F (36.5 C) (Oral)  Resp 20  LMP 01/16/2013      FHT:  FHR: 130s bpm, variability: moderate,  accelerations:  Present,  decelerations:  Absent UC:   regular, every 4 minutes SVE:   Dilation: 1 Effacement (%): 20 Station: -3 Exam by:: dr Leslie Andrea  Labs: Lab Results  Component Value Date   WBC 8.4 10/30/2013   HGB 11.3* 10/30/2013   HCT 33.5* 10/30/2013   MCV 96.0 10/30/2013   PLT 227 10/30/2013    Assessment / Plan: Induction of labor due to postterm,  Made some change on pit and FB now palced  Labor: made some change on pit, FB now placed. Pit may increase to 62mU while in palce. Preeclampsia:  no signs or symptoms of toxicity Fetal Wellbeing:  Category I Pain Control:  Epidural and Fentanyl as needed I/D:  n/a Anticipated MOD:  NSVD  Kathleen Castro 10/30/2013, 2:34 PM

## 2013-10-31 ENCOUNTER — Encounter (HOSPITAL_COMMUNITY): Payer: Medicaid Other | Admitting: Anesthesiology

## 2013-10-31 ENCOUNTER — Encounter (HOSPITAL_COMMUNITY): Payer: Self-pay

## 2013-10-31 ENCOUNTER — Inpatient Hospital Stay (HOSPITAL_COMMUNITY): Payer: Medicaid Other | Admitting: Anesthesiology

## 2013-10-31 MED ORDER — LACTATED RINGERS IV SOLN
500.0000 mL | Freq: Once | INTRAVENOUS | Status: AC
  Administered 2013-10-31: 17:00:00 via INTRAVENOUS

## 2013-10-31 MED ORDER — EPHEDRINE 5 MG/ML INJ: 10.0000 mg | INTRAVENOUS | Status: DC | PRN

## 2013-10-31 MED ORDER — FENTANYL 2.5 MCG/ML BUPIVACAINE 1/10 % EPIDURAL INFUSION (WH - ANES)
INTRAMUSCULAR | Status: AC
  Administered 2013-11-01: 14 mL/h via EPIDURAL
  Filled 2013-10-31: qty 125

## 2013-10-31 MED ORDER — PHENYLEPHRINE 40 MCG/ML (10ML) SYRINGE FOR IV PUSH (FOR BLOOD PRESSURE SUPPORT): 80.0000 ug | PREFILLED_SYRINGE | INTRAVENOUS | Status: DC | PRN

## 2013-10-31 MED ORDER — DIPHENHYDRAMINE HCL 50 MG/ML IJ SOLN
12.5000 mg | INTRAMUSCULAR | Status: DC | PRN
  Administered 2013-10-31: 12.5 mg via INTRAVENOUS
  Filled 2013-10-31: qty 1

## 2013-10-31 MED ORDER — EPHEDRINE 5 MG/ML INJ
INTRAVENOUS | Status: AC
  Filled 2013-10-31: qty 4

## 2013-10-31 MED ORDER — LIDOCAINE HCL (PF) 1 % IJ SOLN
INTRAMUSCULAR | Status: DC | PRN
  Administered 2013-10-31 (×2): 9 mL

## 2013-10-31 MED ORDER — PHENYLEPHRINE 40 MCG/ML (10ML) SYRINGE FOR IV PUSH (FOR BLOOD PRESSURE SUPPORT)
PREFILLED_SYRINGE | INTRAVENOUS | Status: AC
  Filled 2013-10-31: qty 10

## 2013-10-31 MED ORDER — PHENYLEPHRINE 40 MCG/ML (10ML) SYRINGE FOR IV PUSH (FOR BLOOD PRESSURE SUPPORT)
80.0000 ug | PREFILLED_SYRINGE | INTRAVENOUS | Status: AC | PRN
  Administered 2013-11-01: 40 ug via INTRAVENOUS
  Administered 2013-11-01 (×2): 80 ug via INTRAVENOUS
  Administered 2013-11-01 (×4): 40 ug via INTRAVENOUS
  Administered 2013-11-01: 80 ug via INTRAVENOUS
  Administered 2013-11-01: 40 ug via INTRAVENOUS

## 2013-10-31 MED ORDER — FENTANYL 2.5 MCG/ML BUPIVACAINE 1/10 % EPIDURAL INFUSION (WH - ANES)
14.0000 mL/h | INTRAMUSCULAR | Status: DC | PRN
  Administered 2013-11-01 (×2): 14 mL/h via EPIDURAL
  Filled 2013-10-31 (×2): qty 125

## 2013-10-31 MED ORDER — FENTANYL 2.5 MCG/ML BUPIVACAINE 1/10 % EPIDURAL INFUSION (WH - ANES)
INTRAMUSCULAR | Status: DC | PRN
  Administered 2013-10-31: 14 mL/h via EPIDURAL

## 2013-10-31 MED ORDER — LACTATED RINGERS IV SOLN
INTRAVENOUS | Status: DC
  Administered 2013-10-31 – 2013-11-01 (×3): via INTRAUTERINE

## 2013-10-31 NOTE — Anesthesia Procedure Notes (Signed)
Epidural Patient location during procedure: OB Start time: 10/31/2013 5:25 PM End time: 10/31/2013 5:29 PM  Staffing Anesthesiologist: Lyn Hollingshead Performed by: anesthesiologist   Preanesthetic Checklist Completed: patient identified, surgical consent, pre-op evaluation, timeout performed, IV checked, risks and benefits discussed and monitors and equipment checked  Epidural Patient position: sitting Prep: site prepped and draped and DuraPrep Patient monitoring: continuous pulse ox and blood pressure Approach: midline Injection technique: LOR air  Needle:  Needle type: Tuohy  Needle gauge: 17 G Needle length: 9 cm and 9 Needle insertion depth: 5 cm cm Catheter type: closed end flexible Catheter size: 19 Gauge Catheter at skin depth: 10 cm Test dose: negative and Other  Assessment Sensory level: T9 Events: blood not aspirated, injection not painful, no injection resistance, negative IV test and no paresthesia  Additional Notes Reason for block:procedure for pain

## 2013-10-31 NOTE — Progress Notes (Signed)
Stop pitocin. P may shower and eat. Then restart pitocin verbal order Manus Gunning CNM

## 2013-10-31 NOTE — Progress Notes (Signed)
   Kathleen Castro is a 23 y.o. G2P1001 at [redacted]w[redacted]d  admitted for induction of labor due to Post dates. .  Subjective: Contractions are mild  Objective: BP 116/69  Pulse 87  Temp(Src) 97.4 F (36.3 C) (Oral)  Resp 20  LMP 01/16/2013    FHT:  FHR: 140 bpm, variability: moderate,  accelerations:  Present,  decelerations:  Absent UC:   irregular, every 2-4 minutes SVE:   Foley still in  Pitocin @ 6 mu/min  Labs: Lab Results  Component Value Date   WBC 8.4 10/30/2013   HGB 11.3* 10/30/2013   HCT 33.5* 10/30/2013   MCV 96.0 10/30/2013   PLT 227 10/30/2013    Assessment / Plan: IOL for postdates, ripening phase; increase pitocin when foley falls out  Labor: ripening phase Fetal Wellbeing:  Category I Pain Control:  Fentanyl Anticipated MOD:  NSVD  CRESENZO-DISHMAN,Grier Czerwinski 10/31/2013, 12:51 AM

## 2013-10-31 NOTE — Anesthesia Preprocedure Evaluation (Signed)
Anesthesia Evaluation  Patient identified by MRN, date of birth, ID band Patient awake    Reviewed: Allergy & Precautions, H&P , NPO status , Patient's Chart, lab work & pertinent test results  Airway Mallampati: I TM Distance: >3 FB Neck ROM: full    Dental no notable dental hx.    Pulmonary neg pulmonary ROS,    Pulmonary exam normal       Cardiovascular negative cardio ROS      Neuro/Psych negative neurological ROS  negative psych ROS   GI/Hepatic negative GI ROS, Neg liver ROS,   Endo/Other  negative endocrine ROS  Renal/GU negative Renal ROS  negative genitourinary   Musculoskeletal negative musculoskeletal ROS (+)   Abdominal Normal abdominal exam  (+)   Peds  Hematology   Anesthesia Other Findings   Reproductive/Obstetrics (+) Pregnancy                           Anesthesia Physical Anesthesia Plan  ASA: II  Anesthesia Plan: Epidural   Post-op Pain Management:    Induction:   Airway Management Planned:   Additional Equipment:   Intra-op Plan:   Post-operative Plan:   Informed Consent: I have reviewed the patients History and Physical, chart, labs and discussed the procedure including the risks, benefits and alternatives for the proposed anesthesia with the patient or authorized representative who has indicated his/her understanding and acceptance.     Plan Discussed with:   Anesthesia Plan Comments:         Anesthesia Quick Evaluation

## 2013-10-31 NOTE — Progress Notes (Signed)
Kathleen Castro is a 24 y.o. G2P1001 at [redacted]w[redacted]d admitted for induction of labor due to Post dates.  Subjective: Pt rec'd multiple doses of fentanyl. Feeling ctx but difficult to maintain in adequate pattern.  Objective: BP 119/70  Pulse 71  Temp(Src) 98.3 F (36.8 C) (Oral)  Resp 18  LMP 01/16/2013      FHT:  FHR: 130s bpm, variability: moderate,  accelerations:  Present,  decelerations:  Absent UC:   irregular, every 2-6 minutes SVE:   Dilation: 5 Effacement (%): 60 Station: -2 Exam by:: dr Kathleen Castro  Labs: Lab Results  Component Value Date   WBC 8.4 10/30/2013   HGB 11.3* 10/30/2013   HCT 33.5* 10/30/2013   MCV 96.0 10/30/2013   PLT 227 10/30/2013    Assessment / Plan: Induction of labor due to postterm,  Minimal change on pitocin today, no s/p AROM  Labor: S/p AROM, recheck in 2hr, if no change place IUPC Preeclampsia:  no signs or symptoms of toxicity Fetal Wellbeing:  Category I Pain Control:  Epidural and Fentanyl I/D:  n/a Anticipated MOD:  NSVD  Kathleen Castro Kathleen Castro 10/31/2013, 3:40 PM

## 2013-10-31 NOTE — Progress Notes (Signed)
   Kathleen Castro is a 23 y.o. G2P1001 at [redacted]w[redacted]d  admitted for induction of labor due to Post dates..  Subjective: Contractions are still mild  Objective: BP 105/61  Pulse 78  Temp(Src) 97.8 F (36.6 C) (Oral)  Resp 18  LMP 01/16/2013    FHT:  FHR: 130 bpm, variability: moderate,  accelerations:  Present,  decelerations:  Absent UC:   irregular, every 1-5 minutes SVE:  5/70/-3 Pitocin @ 6 mu/min  Labs: Lab Results  Component Value Date   WBC 8.4 10/30/2013   HGB 11.3* 10/30/2013   HCT 33.5* 10/30/2013   MCV 96.0 10/30/2013   PLT 227 10/30/2013    Assessment / Plan: IOL for postdates, ripening phase complete Will allow a light breakfast then increase pitocin until in active labor  Labor: Progressing normally Fetal Wellbeing:  Category I Pain Control:  Fentanyl Anticipated MOD:  NSVD  CRESENZO-DISHMAN,Tahirih Lair 10/31/2013, 7:19 AM

## 2013-10-31 NOTE — Progress Notes (Signed)
Jayce Boyko is a 23 y.o. G2P1001 at [redacted]w[redacted]d by ultrasound admitted for induction of labor due to Post dates. Due date Estimated Date of Delivery: 10/23/13 .  Subjective: Epidural in place and pt is comfortable  Objective: BP 112/72  Pulse 89  Temp(Src) 97.6 F (36.4 C) (Oral)  Resp 20  Ht 5\' 5"  (1.651 m)  Wt 175 lb (79.379 kg)  BMI 29.12 kg/m2  SpO2 100%  LMP 01/16/2013      FHT:  FHR: 140 bpm, variability: moderate,  accelerations:  Present,  decelerations:  Present early UC:   regular, every 4 minutes SVE:   Dilation: 5 Effacement (%): 60 Station: -2 Exam by:: dr odom  Labs: Lab Results  Component Value Date   WBC 8.4 10/30/2013   HGB 11.3* 10/30/2013   HCT 33.5* 10/30/2013   MCV 96.0 10/30/2013   PLT 227 10/30/2013    Assessment / Plan: Induction of labor due to postterm,  progressing well on pitocin History of Caesarean section secondary to non reassuring fetal heart rate tracing  IUPC placed and amnioinfusion begun due to particulate meconium  Labor: Progressing normally Preeclampsia:   Fetal Wellbeing:  Category I Pain Control:  Epidural I/D:   Anticipated MOD:  VBAC  EURE,LUTHER H 10/31/2013, 7:03 PM

## 2013-11-01 ENCOUNTER — Encounter (HOSPITAL_COMMUNITY)
Admission: RE | Disposition: A | Discharge status: Home / Self Care | Payer: Self-pay | Source: Ambulatory Visit | Attending: Obstetrics & Gynecology

## 2013-11-01 ENCOUNTER — Encounter (HOSPITAL_COMMUNITY): Payer: Self-pay

## 2013-11-01 DIAGNOSIS — O34599 Maternal care for other abnormalities of gravid uterus, unspecified trimester: Secondary | ICD-10-CM

## 2013-11-01 DIAGNOSIS — O480 Post-term pregnancy: Secondary | ICD-10-CM

## 2013-11-01 DIAGNOSIS — D279 Benign neoplasm of unspecified ovary: Secondary | ICD-10-CM

## 2013-11-01 DIAGNOSIS — O34219 Maternal care for unspecified type scar from previous cesarean delivery: Secondary | ICD-10-CM

## 2013-11-01 SURGERY — Surgical Case
Anesthesia: Epidural | Site: Abdomen

## 2013-11-01 MED ORDER — SIMETHICONE 80 MG PO CHEW
80.0000 mg | CHEWABLE_TABLET | ORAL | Status: DC | PRN
  Administered 2013-11-02 – 2013-11-03 (×2): 80 mg via ORAL
  Filled 2013-11-01: qty 1

## 2013-11-01 MED ORDER — DIBUCAINE 1 % RE OINT: 1.0000 "application " | TOPICAL_OINTMENT | RECTAL | Status: DC | PRN

## 2013-11-01 MED ORDER — DIPHENHYDRAMINE HCL 50 MG/ML IJ SOLN: 25.0000 mg | INTRAMUSCULAR | Status: DC | PRN

## 2013-11-01 MED ORDER — NALOXONE HCL 0.4 MG/ML IJ SOLN: 0.4000 mg | INTRAMUSCULAR | Status: DC | PRN

## 2013-11-01 MED ORDER — PHENYLEPHRINE 40 MCG/ML (10ML) SYRINGE FOR IV PUSH (FOR BLOOD PRESSURE SUPPORT)
PREFILLED_SYRINGE | INTRAVENOUS | Status: AC
  Filled 2013-11-01: qty 5

## 2013-11-01 MED ORDER — OXYTOCIN 40 UNITS IN LACTATED RINGERS INFUSION - SIMPLE MED: 62.5000 mL/h | INTRAVENOUS | Status: AC

## 2013-11-01 MED ORDER — OXYTOCIN 10 UNIT/ML IJ SOLN
INTRAMUSCULAR | Status: AC
  Filled 2013-11-01: qty 4

## 2013-11-01 MED ORDER — KETOROLAC TROMETHAMINE 30 MG/ML IJ SOLN: 30.0000 mg | Freq: Four times a day (QID) | INTRAMUSCULAR | Status: AC | PRN

## 2013-11-01 MED ORDER — LIDOCAINE-EPINEPHRINE (PF) 2 %-1:200000 IJ SOLN
INTRAMUSCULAR | Status: AC
  Filled 2013-11-01: qty 20

## 2013-11-01 MED ORDER — IBUPROFEN 600 MG PO TABS
600.0000 mg | ORAL_TABLET | Freq: Four times a day (QID) | ORAL | Status: DC
  Administered 2013-11-02 – 2013-11-03 (×6): 600 mg via ORAL
  Filled 2013-11-01 (×6): qty 1

## 2013-11-01 MED ORDER — ONDANSETRON HCL 4 MG PO TABS: 4.0000 mg | ORAL_TABLET | ORAL | Status: DC | PRN

## 2013-11-01 MED ORDER — OXYTOCIN 10 UNIT/ML IJ SOLN
40.0000 [IU] | INTRAVENOUS | Status: DC | PRN
  Administered 2013-11-01: 40 [IU] via INTRAVENOUS

## 2013-11-01 MED ORDER — ONDANSETRON HCL 4 MG/2ML IJ SOLN: 4.0000 mg | INTRAMUSCULAR | Status: DC | PRN

## 2013-11-01 MED ORDER — DIPHENHYDRAMINE HCL 50 MG/ML IJ SOLN: 12.5000 mg | INTRAMUSCULAR | Status: DC | PRN

## 2013-11-01 MED ORDER — SODIUM CHLORIDE 0.9 % IJ SOLN: 3.0000 mL | INTRAMUSCULAR | Status: DC | PRN

## 2013-11-01 MED ORDER — ONDANSETRON HCL 4 MG/2ML IJ SOLN: 4.0000 mg | Freq: Three times a day (TID) | INTRAMUSCULAR | Status: DC | PRN

## 2013-11-01 MED ORDER — MENTHOL 3 MG MT LOZG: 1.0000 | LOZENGE | OROMUCOSAL | Status: DC | PRN

## 2013-11-01 MED ORDER — SENNOSIDES-DOCUSATE SODIUM 8.6-50 MG PO TABS
2.0000 | ORAL_TABLET | ORAL | Status: DC
  Administered 2013-11-02 – 2013-11-03 (×2): 2 via ORAL
  Filled 2013-11-01 (×2): qty 2

## 2013-11-01 MED ORDER — SIMETHICONE 80 MG PO CHEW
80.0000 mg | CHEWABLE_TABLET | ORAL | Status: DC
  Administered 2013-11-02: 80 mg via ORAL
  Filled 2013-11-01 (×2): qty 1

## 2013-11-01 MED ORDER — KETOROLAC TROMETHAMINE 30 MG/ML IJ SOLN
30.0000 mg | Freq: Four times a day (QID) | INTRAMUSCULAR | Status: AC | PRN
  Administered 2013-11-01: 30 mg via INTRAVENOUS

## 2013-11-01 MED ORDER — MEPERIDINE HCL 25 MG/ML IJ SOLN: 6.2500 mg | INTRAMUSCULAR | Status: DC | PRN

## 2013-11-01 MED ORDER — PRENATAL MULTIVITAMIN CH
1.0000 | ORAL_TABLET | Freq: Every day | ORAL | Status: DC
  Administered 2013-11-02: 1 via ORAL
  Filled 2013-11-01: qty 1

## 2013-11-01 MED ORDER — NALBUPHINE HCL 10 MG/ML IJ SOLN
5.0000 mg | INTRAMUSCULAR | Status: DC | PRN
  Administered 2013-11-01: 10 mg via INTRAVENOUS
  Filled 2013-11-01: qty 1

## 2013-11-01 MED ORDER — LANOLIN HYDROUS EX OINT: 1.0000 "application " | TOPICAL_OINTMENT | CUTANEOUS | Status: DC | PRN

## 2013-11-01 MED ORDER — CEFAZOLIN SODIUM-DEXTROSE 2-3 GM-% IV SOLR
2.0000 g | Freq: Once | INTRAVENOUS | Status: AC
  Administered 2013-11-01: 2 g via INTRAVENOUS
  Filled 2013-11-01: qty 50

## 2013-11-01 MED ORDER — WITCH HAZEL-GLYCERIN EX PADS: 1.0000 "application " | MEDICATED_PAD | CUTANEOUS | Status: DC | PRN

## 2013-11-01 MED ORDER — NALBUPHINE HCL 10 MG/ML IJ SOLN: 5.0000 mg | INTRAMUSCULAR | Status: DC | PRN

## 2013-11-01 MED ORDER — SCOPOLAMINE 1 MG/3DAYS TD PT72
MEDICATED_PATCH | TRANSDERMAL | Status: AC
  Administered 2013-11-01: 1.5 mg via TRANSDERMAL
  Filled 2013-11-01: qty 1

## 2013-11-01 MED ORDER — NALOXONE HCL 1 MG/ML IJ SOLN
1.0000 ug/kg/h | INTRAVENOUS | Status: DC | PRN
  Filled 2013-11-01: qty 2

## 2013-11-01 MED ORDER — DIPHENHYDRAMINE HCL 25 MG PO CAPS: 25.0000 mg | ORAL_CAPSULE | ORAL | Status: DC | PRN

## 2013-11-01 MED ORDER — OXYCODONE-ACETAMINOPHEN 5-325 MG PO TABS
1.0000 | ORAL_TABLET | ORAL | Status: DC | PRN
  Administered 2013-11-02: 1 via ORAL
  Filled 2013-11-01: qty 1

## 2013-11-01 MED ORDER — SODIUM BICARBONATE 8.4 % IV SOLN
INTRAVENOUS | Status: AC
  Filled 2013-11-01: qty 50

## 2013-11-01 MED ORDER — SCOPOLAMINE 1 MG/3DAYS TD PT72
1.0000 | MEDICATED_PATCH | Freq: Once | TRANSDERMAL | Status: DC
  Administered 2013-11-01: 1.5 mg via TRANSDERMAL

## 2013-11-01 MED ORDER — HYDROMORPHONE HCL PF 1 MG/ML IJ SOLN: 0.2500 mg | INTRAMUSCULAR | Status: DC | PRN

## 2013-11-01 MED ORDER — KETOROLAC TROMETHAMINE 30 MG/ML IJ SOLN
INTRAMUSCULAR | Status: AC
  Administered 2013-11-01: 30 mg via INTRAVENOUS
  Filled 2013-11-01: qty 1

## 2013-11-01 MED ORDER — SODIUM BICARBONATE 8.4 % IV SOLN
INTRAVENOUS | Status: DC | PRN
  Administered 2013-11-01: 3 mL via EPIDURAL
  Administered 2013-11-01: 2 mL via EPIDURAL
  Administered 2013-11-01 (×2): 5 mL via EPIDURAL

## 2013-11-01 MED ORDER — METOCLOPRAMIDE HCL 5 MG/ML IJ SOLN: 10.0000 mg | Freq: Three times a day (TID) | INTRAMUSCULAR | Status: DC | PRN

## 2013-11-01 MED ORDER — TETANUS-DIPHTH-ACELL PERTUSSIS 5-2.5-18.5 LF-MCG/0.5 IM SUSP: 0.5000 mL | Freq: Once | INTRAMUSCULAR | Status: DC

## 2013-11-01 MED ORDER — BUPIVACAINE HCL (PF) 0.5 % IJ SOLN
INTRAMUSCULAR | Status: DC | PRN
  Administered 2013-11-01: 30 mL

## 2013-11-01 MED ORDER — DIPHENHYDRAMINE HCL 25 MG PO CAPS: 25.0000 mg | ORAL_CAPSULE | Freq: Four times a day (QID) | ORAL | Status: DC | PRN

## 2013-11-01 MED ORDER — ZOLPIDEM TARTRATE 5 MG PO TABS: 5.0000 mg | ORAL_TABLET | Freq: Every evening | ORAL | Status: DC | PRN

## 2013-11-01 MED ORDER — MORPHINE SULFATE (PF) 0.5 MG/ML IJ SOLN
INTRAMUSCULAR | Status: DC | PRN
  Administered 2013-11-01: 1 mg via INTRAVENOUS
  Administered 2013-11-01: 4 mg via EPIDURAL

## 2013-11-01 MED ORDER — ONDANSETRON HCL 4 MG/2ML IJ SOLN
INTRAMUSCULAR | Status: DC | PRN
  Administered 2013-11-01: 4 mg via INTRAVENOUS

## 2013-11-01 MED ORDER — MAGNESIUM HYDROXIDE 400 MG/5ML PO SUSP: 30.0000 mL | ORAL | Status: DC | PRN

## 2013-11-01 MED ORDER — ONDANSETRON HCL 4 MG/2ML IJ SOLN
INTRAMUSCULAR | Status: AC
  Filled 2013-11-01: qty 2

## 2013-11-01 MED ORDER — MEASLES, MUMPS & RUBELLA VAC ~~LOC~~ INJ
0.5000 mL | INJECTION | Freq: Once | SUBCUTANEOUS | Status: DC
  Filled 2013-11-01: qty 0.5

## 2013-11-01 MED ORDER — LACTATED RINGERS IV SOLN
INTRAVENOUS | Status: DC | PRN
  Administered 2013-11-01 (×3): via INTRAVENOUS

## 2013-11-01 MED ORDER — MORPHINE SULFATE 0.5 MG/ML IJ SOLN
INTRAMUSCULAR | Status: AC
  Filled 2013-11-01: qty 10

## 2013-11-01 MED ORDER — CEFAZOLIN SODIUM-DEXTROSE 2-3 GM-% IV SOLR
INTRAVENOUS | Status: AC
  Filled 2013-11-01: qty 50

## 2013-11-01 SURGICAL SUPPLY — 40 items
BENZOIN TINCTURE PRP APPL 2/3 (GAUZE/BANDAGES/DRESSINGS) ×3 IMPLANT
CLAMP CORD UMBIL (MISCELLANEOUS) IMPLANT
CLOSURE WOUND 1/2 X4 (GAUZE/BANDAGES/DRESSINGS) ×1
CLOTH BEACON ORANGE TIMEOUT ST (SAFETY) ×3 IMPLANT
DRAPE LG THREE QUARTER DISP (DRAPES) IMPLANT
DRSG OPSITE POSTOP 4X10 (GAUZE/BANDAGES/DRESSINGS) ×3 IMPLANT
DURAPREP 26ML APPLICATOR (WOUND CARE) ×3 IMPLANT
ELECT REM PT RETURN 9FT ADLT (ELECTROSURGICAL) ×3
ELECTRODE REM PT RTRN 9FT ADLT (ELECTROSURGICAL) ×1 IMPLANT
EXTRACTOR VACUUM M CUP 4 TUBE (SUCTIONS) IMPLANT
EXTRACTOR VACUUM M CUP 4' TUBE (SUCTIONS)
GLOVE BIO SURGEON STRL SZ7 (GLOVE) ×3 IMPLANT
GLOVE BIOGEL PI IND STRL 7.0 (GLOVE) ×1 IMPLANT
GLOVE BIOGEL PI INDICATOR 7.0 (GLOVE) ×2
GOWN PREVENTION PLUS XLARGE (GOWN DISPOSABLE) ×3 IMPLANT
GOWN STRL REIN XL XLG (GOWN DISPOSABLE) ×3 IMPLANT
KIT ABG SYR 3ML LUER SLIP (SYRINGE) IMPLANT
NEEDLE HYPO 22GX1.5 SAFETY (NEEDLE) ×3 IMPLANT
NEEDLE HYPO 25X5/8 SAFETYGLIDE (NEEDLE) ×3 IMPLANT
NS IRRIG 1000ML POUR BTL (IV SOLUTION) ×3 IMPLANT
PACK C SECTION WH (CUSTOM PROCEDURE TRAY) ×3 IMPLANT
PAD ABD 7.5X8 STRL (GAUZE/BANDAGES/DRESSINGS) ×3 IMPLANT
PAD OB MATERNITY 4.3X12.25 (PERSONAL CARE ITEMS) ×3 IMPLANT
RTRCTR C-SECT PINK 25CM LRG (MISCELLANEOUS) IMPLANT
SPONGE GAUZE 4X4 12PLY (GAUZE/BANDAGES/DRESSINGS) ×3 IMPLANT
STAPLER VISISTAT 35W (STAPLE) IMPLANT
STRIP CLOSURE SKIN 1/2X4 (GAUZE/BANDAGES/DRESSINGS) ×2 IMPLANT
SUT PDS AB 0 CT1 27 (SUTURE) ×6 IMPLANT
SUT PDS AB 0 CTX 36 PDP370T (SUTURE) IMPLANT
SUT PLAIN 2 0 XLH (SUTURE) IMPLANT
SUT VIC AB 0 CT1 36 (SUTURE) ×6 IMPLANT
SUT VIC AB 0 CTX 36 (SUTURE) ×4
SUT VIC AB 0 CTX36XBRD ANBCTRL (SUTURE) ×2 IMPLANT
SUT VIC AB 2-0 SH 27 (SUTURE) ×2
SUT VIC AB 2-0 SH 27XBRD (SUTURE) ×1 IMPLANT
SUT VIC AB 4-0 KS 27 (SUTURE) ×3 IMPLANT
SYR 30ML LL (SYRINGE) ×3 IMPLANT
TOWEL OR 17X24 6PK STRL BLUE (TOWEL DISPOSABLE) ×3 IMPLANT
TRAY FOLEY CATH 14FR (SET/KITS/TRAYS/PACK) IMPLANT
WATER STERILE IRR 1000ML POUR (IV SOLUTION) IMPLANT

## 2013-11-01 NOTE — Progress Notes (Signed)
I spoke with and examined patient and agree with medical student's note and plan of care, with the following exception.  Pitocin @ 40mu/min at time of note, and will not exceed 64mu/min d/t prev c/s.  Roma Schanz, CNM, [facility] 11/01/2013

## 2013-11-01 NOTE — Op Note (Addendum)
Kathleen Castro PROCEDURE DATE: 11/01/2013  PREOPERATIVE DIAGNOSES: Intrauterine pregnancy at  [redacted]w[redacted]d weeks gestation;previous cesarean section; failed TOLAC attempt;  failure to progress: arrest of dilation  POSTOPERATIVE DIAGNOSES: The same; right ovarian solid and cystic mass  PROCEDURE: Repeat Low Transverse Cesarean Section; Partial Right Oophorectomy  SURGEON:  Dr. Verita Schneiders  ASSISTANT:  Dr. Otelia Limes. Odom  ANESTHESIOLOGIST: Dr. Lyndle Herrlich  INDICATIONS: Kathleen Castro is a 23 y.o. [phone] at [redacted]w[redacted]d here for cesarean section secondary to the indications listed under preoperative diagnoses; please see preoperative note for further details.  The risks of cesarean section were discussed with the patient including but were not limited to: bleeding which may require transfusion or reoperation; infection which may require antibiotics; injury to bowel, bladder, ureters or other surrounding organs; injury to the fetus; need for additional procedures including hysterectomy in the event of a life-threatening hemorrhage; placental abnormalities wth subsequent pregnancies, incisional problems, thromboembolic phenomenon and other postoperative/anesthesia complications.   The patient concurred with the proposed plan, giving informed written consent for the procedure.    FINDINGS:  Viable female infant in cephalic presentation.  Apgars 8 and 9  Clear amniotic fluid.  Intact placenta, three vessel cord.  Normal uterus and fallopian tubes bilaterally.  Normal left ovary but right ovary had a 5 cm solid and cystic mass on the superior pole of the ovary, this was resected and sent to pathology.  ANESTHESIA: Spinal INTRAVENOUS FLUIDS: 2600 ml ESTIMATED BLOOD LOSS: 800 ml URINE OUTPUT:  500 ml SPECIMENS: Placenta and right ovarian mass sent to pathology COMPLICATIONS: None immediate  PROCEDURE IN DETAIL:  The patient preoperatively received intravenous antibiotics and had sequential compression devices  applied to her lower extremities.  She was then taken to the operating room where spinal anesthesia was administered and was found to be adequate. She was then placed in a dorsal supine position with a leftward tilt, and prepped and draped in a sterile manner.  A foley catheter was placed into her bladder and attached to constant gravity.  After an adequate timeout was performed, a Pfannenstiel skin incision was made with scalpel and carried through to the underlying layer of fascia. The fascia was incised in the midline, and this incision was extended bilaterally using the Mayo scissors.  Kocher clamps were applied to the superior aspect of the fascial incision and the underlying rectus muscles were dissected off bluntly. A similar process was carried out on the inferior aspect of the fascial incision. The rectus muscles were separated in the midline bluntly and the peritoneum was entered bluntly. Attention was turned to the lower uterine segment where a low transverse hysterotomy was made with a scalpel and extended bilaterally bluntly.  The infant was successfully delivered, the cord was clamped and cut and the infant was handed over to awaiting neonatology team. Uterine massage was then administered, and the placenta delivered intact with a three-vessel cord. The uterus was then cleared of clot and debris.  The hysterotomy was closed with 0 Vicryl in a running locked fashion, and an imbricating layer was also placed with 0 Vicryl. The pelvis was cleared of all clot and debris and the uterus and adnexa were examined in detail.   At this point, it was noted that the right ovary had a 5 cm solid and cystic mass.  About a 3 cm solid mass was palpated within this mass.  Clamps were placed under the mass and it was transected from the superior portion of the right ovary.  The pedicle was triply suture ligated.   Hemostasis was confirmed on all surfaces.    The fascia was then closed using 0 PDS in a running  fashion.  The subcutaneous layer was irrigated, and 30 ml of 0.5% Marcaine was injected subcutaneously around the incision.  The skin was closed with a 4-0 Vicryl subcuticular stitch. The patient tolerated the procedure well. Sponge, lap, instrument and needle counts were correct x 2.  She was taken to the recovery room in stable condition.     Verita Schneiders, MD, [place] Attending [place], [place]

## 2013-11-01 NOTE — Progress Notes (Signed)
Patient ID: Kathleen Castro, female   DOB: 1990-12-09, 23 y.o.   MRN: [id number]  Kathleen Castro is a 23 y.o. G2P1001 at [redacted]w[redacted]d  admitted for induction of labor due to postdates.  Subjective: Comfortable w/ epidural, no complaints. Pt wishes to continue attempting VBAC.   Objective: BP 130/75  Pulse 90  Temp(Src) 98.1 F (36.7 C) (Oral)  Resp 20  Ht 5\' 5"  (1.651 m)  Wt 175 lb (79.379 kg)  BMI 29.12 kg/m2  SpO2 100%  LMP 01/16/2013 Total I/O In: 80 [Other:80] Out: -   FHT:  FHR: 125 bpm, variability: moderate,  accelerations:  Present,  decelerations:  Absent UC:   regular, every 2-4 minutes  SVE:   Dilation: 7 Effacement (%): 90 Station: 0 Exam by:: KBooker,CNM Pitocin @ 20 mu/min  Labs: Lab Results  Component Value Date   WBC 8.4 10/30/2013   HGB 11.3* 10/30/2013   HCT 33.5* 10/30/2013   MCV 96.0 10/30/2013   PLT 227 10/30/2013    Assessment / Plan: Protracted active phase/arrest of cervical dilation at [redacted]w[redacted]d, attempting TOLAC.  Patient has not had significant cervical change for several hours despite periods of adequacy.   Cesarean section recommended.  The risks of cesarean section discussed with the patient included but were not limited to: bleeding which may require transfusion or reoperation; infection which may require antibiotics; injury to bowel, bladder, ureters or other surrounding organs; injury to the fetus; need for additional procedures including hysterectomy in the event of a life-threatening hemorrhage; placental abnormalities wth subsequent pregnancies, incisional problems, thromboembolic phenomenon and other postoperative/anesthesia complications. The patient concurred with the proposed plan, giving informed written consent for the procedure.  Anesthesia and OR aware. Preoperative prophylactic antibiotics and SCDs ordered on call to the OR.  To OR when ready.    Verita Schneiders, MD, [place] Attending [place], [place]

## 2013-11-01 NOTE — Progress Notes (Signed)
Patient ID: Kathleen Castro, female   DOB: November 11, 1990, 23 y.o.   MRN: [id number] Kathleen Castro is a 23 y.o. G2P1001 at [redacted]w[redacted]d admitted for induction of labor due to Post dates.  Subjective: Patient is comfortable on epidural, was able to get some sleep.    Objective: BP 115/55  Pulse 54  Temp(Src) 98.1 F (36.7 C) (Oral)  Resp 18  Ht 5\' 5"  (1.651 m)  Wt 79.379 kg (175 lb)  BMI 29.12 kg/m2  SpO2 100%  LMP 01/16/2013 I/O last 3 completed shifts: In: -  Out: 200 [Urine:200]    FHT:  FHR: 130 bpm, variability: minimal ,  accelerations:  Abscent,  decelerations:  Absent UC:   regular, every 2-3 minutes SVE:   Dilation: 7 Effacement (%): 80 Station: 0 Exam by:: kbooker, cnm  Labs: Lab Results  Component Value Date   WBC 8.4 10/30/2013   HGB 11.3* 10/30/2013   HCT 33.5* 10/30/2013   MCV 96.0 10/30/2013   PLT 227 10/30/2013    Assessment / Plan: Induction of labor due to postdate No significant change in cervix since last exam Discussed Plan of Care with Dr Harolyn Rutherford, due to previous C-Section, pitocin cannot go any higher. Pt is currently at 72mIU Re assess in 2 hours and consider a repeat c section if no change  Labor: Slow progress on  pitocin Fetal Wellbeing:  Category II Pain Control:  Epidural I/D:  n/a Anticipated MOD:  hopeful for VBAC  Kathleen Castro 11/01/2013, 11:36 AM

## 2013-11-01 NOTE — Transfer of Care (Signed)
Immediate Anesthesia Transfer of Care Note  Patient: Kathleen Castro  Procedure(s) Performed: Procedure(s): CESAREAN SECTION and partial right oophorectomy (N/A)  Patient Location: PACU  Anesthesia Type:Epidural  Level of Consciousness: awake and alert   Airway & Oxygen Therapy: Patient Spontanous Breathing  Post-op Assessment: Report given to PACU RN and Post -op Vital signs reviewed and stable  Post vital signs: Reviewed and stable  Complications: No apparent anesthesia complications

## 2013-11-01 NOTE — Progress Notes (Signed)
Cardio replaced, keeps beeping, pt moving around bed as FOB gives pt a bed bath. Pt assisted back to [facility] and new cardio adjusted.

## 2013-11-01 NOTE — Progress Notes (Signed)
Kathleen Castro is a 23 y.o. G2P1001 at [redacted]w[redacted]d   Subjective: Comfortable with epidural  Objective: BP 119/73  Pulse 100  Temp(Src) 98.1 F (36.7 C) (Oral)  Resp 18  Ht 5\' 5"  (1.651 m)  Wt 79.379 kg (175 lb)  BMI 29.12 kg/m2  SpO2 100%  LMP 01/16/2013 I/O last 3 completed shifts: In: -  Out: 200 [Urine:200]    FHT:  FHR: 130s bpm, variability: moderate,  accelerations:  Present,  decelerations:  Absent UC:   regular, every 2-3 minutes with Pitocin 31mu/min; MVUs adequate SVE:   Dilation: 7 Effacement (%): 80 Station: -2 Exam by:: Lynnda Child, CNM- vtx feels more applied to cx now  Labs: Lab Results  Component Value Date   WBC 8.4 10/30/2013   HGB 11.3* 10/30/2013   HCT 33.5* 10/30/2013   MCV 96.0 10/30/2013   PLT 227 10/30/2013    Assessment / Plan: Early active phase TOLAC  Beginning to make some cx change Will continue to labor Dr Elonda Husky updated  Serita Grammes 11/01/2013, 7:19 AM

## 2013-11-01 NOTE — Anesthesia Postprocedure Evaluation (Signed)
  Anesthesia Post-op Note  Patient: Kathleen Castro  Procedure(s) Performed: Procedure(s): CESAREAN SECTION and partial right oophorectomy (N/A)  Patient is awake, responsive, moving her legs, and has signs of resolution of her numbness. Pain and nausea are reasonably well controlled. Vital signs are stable and clinically acceptable. Oxygen saturation is clinically acceptable. There are no apparent anesthetic complications at this time. Patient is ready for discharge.

## 2013-11-01 NOTE — Progress Notes (Addendum)
Patient ID: Kathleen Castro, female   DOB: Feb 03, 1991, 23 y.o.   MRN: [id number] Kathleen Castro is a 23 y.o. G2P1001 at [redacted]w[redacted]d  admitted for induction of labor due to postdates.  Subjective: Mostly comfortable w/ epidural, feeling some low back pain. Denies pressure.   Objective: BP 121/68  Pulse 66  Temp(Src) 98.4 F (36.9 C) (Oral)  Resp 18  Ht 5\' 5"  (1.651 m)  Wt 79.379 kg (175 lb)  BMI 29.12 kg/m2  SpO2 100%  LMP 01/16/2013    FHT:  FHR: 125 bpm, variability: moderate,  accelerations:  Present,  decelerations:  Absent UC:   regular, every 3 minutes MVUs 160-180  SVE:   7/80/-0, KBooker, CNM  Pitocin @ 10 mu/min  Had previously been receiving amnioinfusion for thick particulate mec, but per RN report it was turned off this am b/c wasn't getting adequate return  Labs: Lab Results  Component Value Date   WBC 8.4 10/30/2013   HGB 11.3* 10/30/2013   HCT 33.5* 10/30/2013   MCV 96.0 10/30/2013   PLT 227 10/30/2013    Assessment / Plan: IOL d/t postdates, TOLAC, no cervical change but vtx has descended. Pt sitting high-fowler's d/t discomfort when lying on sides for prolonged periods of time. Will try exaggerated sims w/ peanut ball and increase pitocin to achieve adequate mvu's.   Labor: inadequate mvu's, no cervical change but +descent of vtx Fetal Wellbeing:  Category I Pain Control:  Epidural Pre-eclampsia: n/a I/D:  n/a Anticipated MOD:  hopeful for VBAC  Tawnya Crook CNM, [facility]NP-[facility] 11/01/2013, 8:59 AM

## 2013-11-01 NOTE — Progress Notes (Signed)
Patient ID: Kathleen Castro, female   DOB: 12/25/1990, 23 y.o.   MRN: [id number] Kathleen Castro is a 23 y.o. G2P1001 at [redacted]w[redacted]d  admitted for induction of labor due to postdates.  Subjective: Comfortable w/ epidural, no complaints. Pt wishes to continue attempting VBAC.   Objective: BP 132/89  Pulse 105  Temp(Src) 97.1 F (36.2 C) (Oral)  Resp 18  Ht 5\' 5"  (1.651 m)  Wt 79.379 kg (175 lb)  BMI 29.12 kg/m2  SpO2 100%  LMP 01/16/2013 Total I/O In: 31 [Other:80] Out: -   FHT:  FHR: 125 bpm, variability: moderate,  accelerations:  Present,  decelerations:  Absent UC:   regular, every 2-4 minutes  SVE:   Dilation: 7 Effacement (%): 90 Station: 0 Exam by:: kbooker,cnm Good 7 now, 0 to 1+, w/ + bloody show which she hasn't had before  Pitocin @ 20 mu/min  Labs: Lab Results  Component Value Date   WBC 8.4 10/30/2013   HGB 11.3* 10/30/2013   HCT 33.5* 10/30/2013   MCV 96.0 10/30/2013   PLT 227 10/30/2013    Assessment / Plan: IOL d/t postdates, TOLAC, some cervical change- discussed w/ dr. Harolyn Rutherford, will continue to proceed for now. Will recheck cervix in 2hrs.  Do not exceed 68mu/min of pitocin.   Labor: protracted  Fetal Wellbeing:  Category I Pain Control:  Epidural Pre-eclampsia: n/a I/D:  n/a Anticipated MOD:  hopeful for VBAC  Tawnya Crook CNM, [facility]NP-BC 11/01/2013, 1:51 PM

## 2013-11-01 NOTE — Progress Notes (Signed)
Farhana Fellows is a 23 y.o. G2P1001 at [redacted]w[redacted]d   Subjective: Comfortable with epidural  Objective: BP 123/63  Pulse 71  Temp(Src) 98.5 F (36.9 C) (Oral)  Resp 18  Ht 5\' 5"  (1.651 m)  Wt 79.379 kg (175 lb)  BMI 29.12 kg/m2  SpO2 100%  LMP 01/16/2013   Total I/O In: -  Out: 200 [Urine:200]  FHT:  FHR: 120-130 bpm, variability: moderate,  accelerations:  Present,  decelerations:  Absent UC:   regular, every 2-4 minutes; MVUs 150 currently, but have been adequate previously SVE:   Dilation: 6 Effacement (%): 70 Station: -2 Exam by:: K Merritt Kibby- vtx not well applied to cx  Labs: Lab Results  Component Value Date   WBC 8.4 10/30/2013   HGB 11.3* 10/30/2013   HCT 33.5* 10/30/2013   MCV 96.0 10/30/2013   PLT 227 10/30/2013    Assessment / Plan: IOL process Labor not achieved yet  Rev'd with Dr Elonda Husky- will continue to monitor and will be looking for active labor/cx change by 5am; if not achieved by that point, will consider alternate plan for delivery.   Pritesh Sobecki 11/01/2013, 1:12 AM

## 2013-11-02 LAB — CBC
HCT: 31.1 % — ABNORMAL LOW (ref 36.0–46.0)
Hemoglobin: 10.6 g/dL — ABNORMAL LOW (ref 12.0–15.0)
MCH: 32.6 pg (ref 26.0–34.0)
MCHC: 34.1 g/dL (ref 30.0–36.0)
MCV: 95.7 fL (ref 78.0–100.0)
PLATELETS: 220 10*3/uL (ref 150–400)
RBC: 3.25 MIL/uL — ABNORMAL LOW (ref 3.87–5.11)
RDW: 13.2 % (ref 11.5–15.5)
WBC: 15.7 10*3/uL — ABNORMAL HIGH (ref 4.0–10.5)

## 2013-11-02 NOTE — Plan of Care (Signed)
Problem: Discharge Progression Outcomes Goal: Remove staples per MD order Outcome: Not Applicable Date Met:  83/46/21 Under honey comb there are steri strips.

## 2013-11-02 NOTE — Anesthesia Postprocedure Evaluation (Signed)
  Anesthesia Post-op Note  Patient: Kathleen Castro  Procedure(s) Performed: * No procedures listed *  Patient Location: Mother/Baby  Anesthesia Type:Epidural  Level of Consciousness: awake, alert  and oriented  Airway and Oxygen Therapy: Patient Spontanous Breathing  Post-op Pain: none  Post-op Assessment: Post-op Vital signs reviewed, Patient's Cardiovascular Status Stable, Respiratory Function Stable, No headache, No backache, No residual numbness and No residual motor weakness  Post-op Vital Signs: Reviewed and stable  Complications: No apparent anesthesia complications

## 2013-11-02 NOTE — Progress Notes (Signed)
Subjective: Postpartum Day 1: RLTCesarean Delivery d/t failed TOLAC after IOL d/t postdates Eating, drinking well. Foley just removed around 0500, so has not yet ambulated or voided. +flatus.  Lochia and pain wnl.  Denies dizziness, lightheadedness, or sob. No complaints.   Objective: Vital signs in last 24 hours: Temp:  [97.1 F (36.2 C)-99.1 F (37.3 C)] 98 F (36.7 C) (01/18 0428) Pulse Rate:  [54-119] 88 (01/18 0430) Resp:  [16-20] 20 (01/18 0430) BP: (94-140)/(54-93) 106/70 mmHg (01/18 0430) SpO2:  [96 %-100 %] 96 % (01/18 0429)  Physical Exam:  General: alert, cooperative and no distress Lochia: appropriate Uterine Fundus: firm Incision: no significant drainage DVT Evaluation: No evidence of DVT seen on physical exam. Negative Homan's sign. No cords or calf tenderness. No significant calf/ankle edema.   Recent Labs  10/30/13 0815 11/02/13 0620  HGB 11.3* 10.6*  HCT 33.5* 31.1*    Assessment/Plan: Status post Cesarean section. Doing well postoperatively.  Continue current care. Breastfeeding, plans for Mirena for contraception. Outpatient circ.  Tawnya Crook 11/02/2013, 7:28 AM

## 2013-11-03 MED ORDER — OXYCODONE-ACETAMINOPHEN 5-325 MG PO TABS: 1.0000 | ORAL_TABLET | ORAL | Status: DC | PRN

## 2013-11-03 MED ORDER — OXYCODONE-ACETAMINOPHEN 5-325 MG PO TABS: 1.0000 | ORAL_TABLET | ORAL | Status: AC | PRN

## 2013-11-03 MED ORDER — IBUPROFEN 600 MG PO TABS: 600.0000 mg | ORAL_TABLET | Freq: Four times a day (QID) | ORAL | Status: AC

## 2013-11-03 NOTE — Progress Notes (Signed)
UR chart review completed.  

## 2013-11-03 NOTE — Discharge Instructions (Signed)
Cesarean Delivery Care After Refer to this sheet in the next few weeks. These instructions provide you with information on caring for yourself after your procedure. Your health care provider may also give you specific instructions. Your treatment has been planned according to current medical practices, but problems sometimes occur. Call your health care provider if you have any problems or questions after you go home. HOME CARE INSTRUCTIONS  Only take over-the-counter or prescription medications as directed by your health care provider.  Do not drink alcohol, especially if you are breastfeeding or taking medication to relieve pain.  Do not chew or smoke tobacco.  Continue to use good perineal care. Good perineal care includes:  Wiping your perineum from front to back.  Keeping your perineum clean.  Check your surgical cut (incision) daily for increased redness, drainage, swelling, or separation of skin.  Clean your incision gently with soap and water every day, and then pat it dry. If your health care provider says it is OK, leave the incision uncovered. Use a bandage (dressing) if the incision is draining fluid or appears irritated. If the adhesive strips across the incision do not fall off within 7 days, carefully peel them off.  Hug a pillow when coughing or sneezing until your incision is healed. This helps to relieve pain.  Do not use tampons or douche until your health care provider says it is okay.  Shower, wash your hair, and take tub baths as directed by your health care provider.  Wear a well-fitting bra that provides breast support.  Limit wearing support panties or control-top hose.  Drink enough fluids to keep your urine clear or pale yellow.  Eat high-fiber foods such as whole grain cereals and breads, brown rice, beans, and fresh fruits and vegetables every day. These foods may help prevent or relieve constipation.  Resume activities such as climbing stairs,  driving, lifting, exercising, or traveling as directed by your health care provider.  Talk to your health care provider about resuming sexual activities. This is dependent upon your risk of infection, your rate of healing, and your comfort and desire to resume sexual activity.  Try to have someone help you with your household activities and your newborn for at least a few days after you leave the hospital.  Rest as much as possible. Try to rest or take a nap when your newborn is sleeping.  Increase your activities gradually.  Keep all of your scheduled postpartum appointments. It is very important to keep your scheduled follow-up appointments. At these appointments, your health care provider will be checking to make sure that you are healing physically and emotionally. SEEK MEDICAL CARE IF:   You are passing large clots from your vagina. Save any clots to show your health care provider.  You have a foul smelling discharge from your vagina.  You have trouble urinating.  You are urinating frequently.  You have pain when you urinate.  You have a change in your bowel movements.  You have increasing redness, pain, or swelling near your incision.  You have pus draining from your incision.  Your incision is separating.  You have painful, hard, or reddened breasts.  You have a severe headache.  You have blurred vision or see spots.  You feel sad or depressed.  You have thoughts of hurting yourself or your newborn.  You have questions about your care, the care of your newborn, or medications.  You are dizzy or lightheaded.  You have a rash.  You  have pain, redness, or swelling at the site of the removed intravenous access (IV) tube. °· You have nausea or vomiting. °· You stopped breastfeeding and have not had a menstrual period within 12 weeks of stopping. °· You are not breastfeeding and have not had a menstrual period within 12 weeks of delivery. °· You have a fever. °SEEK  IMMEDIATE MEDICAL CARE IF: °· You have persistent pain. °· You have chest pain. °· You have shortness of breath. °· You faint. °· You have leg pain. °· You have stomach pain. °· Your vaginal bleeding saturates 2 or more sanitary pads in 1 hour. °MAKE SURE YOU:  °· Understand these instructions. °· Will watch your condition. °· Will get help right away if you are not doing well or get worse. °Document Released: 06/24/2002 Document Revised: 06/04/2013 Document Reviewed: 05/29/2012 °[employer]® Patient Information ©2014 [employer]. ° ° ° °

## 2013-11-03 NOTE — Discharge Summary (Signed)
Obstetric Discharge Summary Reason for Admission: induction of labor Prenatal Procedures: none Intrapartum Procedures: cesarean: low cervical, transverse Postpartum Procedures: none Complications-Operative and Postpartum: none Hemoglobin  Date Value Range Status  11/02/2013 10.6* 12.0 - 15.0 g/dL Final  04/30/2013 11.7   Final     HCT  Date Value Range Status  11/02/2013 31.1* 36.0 - 46.0 % Final   Hospital Course Kathleen Castro is a 23 y.o. [id number] who presented at [redacted]w[redacted]d for IOL for post-dates.  RLTCS due to failed TOLAC, arrested dilatation at 7 cm.  Right ovarian cystic/solid mass was noted and extracted and sent to pathology.  Healthy baby boy with APGARs of 8,9.  POD #2.  Patient is doing well overall.  Bleeding is less than a period.  Pain is well-controlled with meds. Ambulating well and tolerating PO. No BM yet, + Flatus. Choosing Mirena for MOC. Plans to bottle-feed.  Baby boy will be circumcised out-patient.  Discussed post-operative CS instructions.  Schedule follow-up visit in 4-6 weeks.    PT WILL NEED F/U OF OVARIAN MASS PATHOLOGY.   Physical Exam:  General: alert, cooperative and no distress Lochia: appropriate Uterine Fundus: firm Incision: no significant drainage DVT Evaluation: No evidence of DVT seen on physical exam.  Discharge Diagnoses: Failed induction and Post-date pregnancy  Discharge Information: Date: 11/03/2013 Activity: pelvic rest Diet: routine Medications: Ibuprofen and Percocet Condition: stable Instructions: refer to practice specific booklet Discharge to: home   Newborn Data: Live born female  Birth Weight: 9 lb 7.7 oz (4300 g) APGAR: 8, 9  Home with mother.  TUCKER, BRITTON L 11/03/2013, 7:19 AM  I have seen and examined this patient and agree with above documentation in the PA student's note.   Kathleen Castro, M.D. [facility] Fellow 11/03/2013 9:05 AM

## 2013-11-05 ENCOUNTER — Telehealth: Payer: Self-pay | Admitting: *Deleted

## 2013-11-05 NOTE — Telephone Encounter (Signed)
Message copied by Sue Lush on Wed Nov 05, 2013  1:52 PM ------      Message from: Verita Schneiders A      Created: Wed Nov 05, 2013 12:57 PM       Removed ovarian cyst was a dermoid cyst, benign.  Please call to inform patient of results.       ------

## 2013-11-05 NOTE — Telephone Encounter (Signed)
Called patient and left message for pt to call office for results.

## 2013-11-06 NOTE — Telephone Encounter (Signed)
Called patient, no answer- left message that we are trying to get in touch with you with some information, please call us back at the clinics

## 2013-11-06 NOTE — Telephone Encounter (Signed)
Pt. Called nurse line stating she is returning our call. Called pt. And left message stating we are returning her call, just informing her of results, good news and nothing urgent, call clinic when you can.

## 2013-11-10 NOTE — Telephone Encounter (Signed)
Called patient, no answer- left message that we are trying to get in touch with you about some results, nothing urgent but to call us back at the clinics and let us know if we can leave detailed information on your voicemail

## 2013-11-12 ENCOUNTER — Encounter: Payer: Self-pay | Admitting: General Practice

## 2013-11-12 NOTE — Telephone Encounter (Signed)
Called patient, no answer- will send patient letter as we have been unable to reach her

## 2013-11-27 ENCOUNTER — Ambulatory Visit: Payer: Medicaid Other | Admitting: Obstetrics and Gynecology

## 2013-11-28 ENCOUNTER — Encounter: Payer: Self-pay | Admitting: *Deleted

## 2013-12-15 ENCOUNTER — Ambulatory Visit: Payer: Medicaid Other | Admitting: Family Medicine

## 2014-01-12 ENCOUNTER — Ambulatory Visit: Payer: Medicaid Other | Admitting: Obstetrics & Gynecology

## 2014-01-12 ENCOUNTER — Encounter: Payer: Self-pay | Admitting: General Practice

## 2014-01-12 ENCOUNTER — Telehealth: Payer: Self-pay | Admitting: General Practice

## 2014-01-12 NOTE — Telephone Encounter (Signed)
Patient no showed for pp visit. Called patient, no answer- left message stating it appears you missed your appt with [place] today, if you would like to reschedule this appt please give our front office staff a call. Will send patient letter

## 2014-03-13 ENCOUNTER — Encounter: Payer: Self-pay | Admitting: General Practice

## 2014-05-08 ENCOUNTER — Encounter: Payer: Self-pay | Admitting: General Practice

## 2014-08-17 ENCOUNTER — Encounter (HOSPITAL_COMMUNITY): Payer: Self-pay

## 2014-09-17 ENCOUNTER — Encounter: Payer: Self-pay | Admitting: Obstetrics & Gynecology
